# Patient Record
Sex: Male | Born: 1980 | Race: White | Hispanic: No | Marital: Married | Smoking: Former smoker
Health system: Southern US, Community
[De-identification: ages and names within clinical notes are randomized; demographics above are authoritative.]

## PROBLEM LIST (undated history)

## (undated) DIAGNOSIS — E119 Type 2 diabetes mellitus without complications: Secondary | ICD-10-CM

## (undated) DIAGNOSIS — F419 Anxiety disorder, unspecified: Secondary | ICD-10-CM

## (undated) HISTORY — PX: EYE SURGERY: SHX253

## (undated) HISTORY — PX: CATARACT EXTRACTION: SUR2

---

## 2015-11-05 ENCOUNTER — Emergency Department
Admission: EM | Admit: 2015-11-05 | Discharge: 2015-11-05 | Disposition: A | Discharge status: Home / Self Care | Payer: Self-pay | Attending: Emergency Medicine | Admitting: Emergency Medicine

## 2015-11-05 ENCOUNTER — Encounter: Payer: Self-pay | Admitting: Emergency Medicine

## 2015-11-05 ENCOUNTER — Emergency Department: Payer: Self-pay

## 2015-11-05 DIAGNOSIS — F17200 Nicotine dependence, unspecified, uncomplicated: Secondary | ICD-10-CM | POA: Insufficient documentation

## 2015-11-05 DIAGNOSIS — Z794 Long term (current) use of insulin: Secondary | ICD-10-CM | POA: Insufficient documentation

## 2015-11-05 DIAGNOSIS — E109 Type 1 diabetes mellitus without complications: Secondary | ICD-10-CM | POA: Insufficient documentation

## 2015-11-05 DIAGNOSIS — M791 Myalgia, unspecified site: Secondary | ICD-10-CM

## 2015-11-05 HISTORY — DX: Type 2 diabetes mellitus without complications: E11.9

## 2015-11-05 LAB — CBC WITH DIFFERENTIAL/PLATELET
Basophils Absolute: 0 10*3/uL (ref 0–0.1)
Basophils Relative: 0 %
Eosinophils Absolute: 0.2 10*3/uL (ref 0–0.7)
Eosinophils Relative: 2 %
HCT: 37.1 % — ABNORMAL LOW (ref 40.0–52.0)
Hemoglobin: 12.4 g/dL — ABNORMAL LOW (ref 13.0–18.0)
LYMPHS ABS: 0.8 10*3/uL — AB (ref 1.0–3.6)
LYMPHS PCT: 5 %
MCH: 29.2 pg (ref 26.0–34.0)
MCHC: 33.4 g/dL (ref 32.0–36.0)
MCV: 87.4 fL (ref 80.0–100.0)
Monocytes Absolute: 0.6 10*3/uL (ref 0.2–1.0)
Monocytes Relative: 4 %
NEUTROS ABS: 13.8 10*3/uL — AB (ref 1.4–6.5)
NEUTROS PCT: 89 %
PLATELETS: 287 10*3/uL (ref 150–440)
RBC: 4.24 MIL/uL — AB (ref 4.40–5.90)
RDW: 14.5 % (ref 11.5–14.5)
WBC: 15.5 10*3/uL — AB (ref 3.8–10.6)

## 2015-11-05 LAB — COMPREHENSIVE METABOLIC PANEL
ALT: 15 U/L — ABNORMAL LOW (ref 17–63)
ANION GAP: 8 (ref 5–15)
AST: 21 U/L (ref 15–41)
Albumin: 3.5 g/dL (ref 3.5–5.0)
Alkaline Phosphatase: 89 U/L (ref 38–126)
BILIRUBIN TOTAL: 0.6 mg/dL (ref 0.3–1.2)
BUN: 31 mg/dL — AB (ref 6–20)
CHLORIDE: 104 mmol/L (ref 101–111)
CO2: 26 mmol/L (ref 22–32)
Calcium: 8.7 mg/dL — ABNORMAL LOW (ref 8.9–10.3)
Creatinine, Ser: 1.02 mg/dL (ref 0.61–1.24)
Glucose, Bld: 83 mg/dL (ref 65–99)
POTASSIUM: 4.1 mmol/L (ref 3.5–5.1)
Sodium: 138 mmol/L (ref 135–145)
TOTAL PROTEIN: 7.4 g/dL (ref 6.5–8.1)

## 2015-11-05 LAB — URINALYSIS COMPLETE WITH MICROSCOPIC (ARMC ONLY)
Bacteria, UA: NONE SEEN
Bilirubin Urine: NEGATIVE
Glucose, UA: NEGATIVE mg/dL
LEUKOCYTES UA: NEGATIVE
Nitrite: NEGATIVE
PH: 5 (ref 5.0–8.0)
PROTEIN: 100 mg/dL — AB
Specific Gravity, Urine: 1.03 (ref 1.005–1.030)
Squamous Epithelial / LPF: NONE SEEN

## 2015-11-05 LAB — GLUCOSE, CAPILLARY
GLUCOSE-CAPILLARY: 23 mg/dL — AB (ref 65–99)
GLUCOSE-CAPILLARY: 70 mg/dL (ref 65–99)
GLUCOSE-CAPILLARY: 115 mg/dL — AB (ref 65–99)

## 2015-11-05 LAB — SEDIMENTATION RATE: Sed Rate: 55 mm/hr — ABNORMAL HIGH (ref 0–15)

## 2015-11-05 LAB — CK: Total CK: 67 U/L (ref 49–397)

## 2015-11-05 LAB — LACTIC ACID, PLASMA: Lactic Acid, Venous: 1 mmol/L (ref 0.5–2.0)

## 2015-11-05 MED ORDER — KETOROLAC TROMETHAMINE 30 MG/ML IJ SOLN
30.0000 mg | Freq: Once | INTRAMUSCULAR | Status: AC
  Administered 2015-11-05: 30 mg via INTRAVENOUS
  Filled 2015-11-05: qty 1

## 2015-11-05 MED ORDER — DEXTROSE 50 % IV SOLN
25.0000 g | Freq: Once | INTRAVENOUS | Status: AC
  Administered 2015-11-05: 25 g via INTRAVENOUS

## 2015-11-05 MED ORDER — METHOCARBAMOL 750 MG PO TABS: 750.0000 mg | ORAL_TABLET | Freq: Four times a day (QID) | ORAL | Status: DC

## 2015-11-05 MED ORDER — DEXTROSE 50 % IV SOLN
INTRAVENOUS | Status: AC
  Administered 2015-11-05: 25 g via INTRAVENOUS
  Filled 2015-11-05: qty 100

## 2015-11-05 MED ORDER — KETOROLAC TROMETHAMINE 10 MG PO TABS: 10.0000 mg | ORAL_TABLET | Freq: Four times a day (QID) | ORAL | Status: DC | PRN

## 2015-11-05 MED ORDER — TRAMADOL HCL 50 MG PO TABS
50.0000 mg | ORAL_TABLET | Freq: Once | ORAL | Status: AC
  Administered 2015-11-05: 50 mg via ORAL
  Filled 2015-11-05: qty 1

## 2015-11-05 MED ORDER — DOXYCYCLINE MONOHYDRATE 100 MG PO CAPS: 100.0000 mg | ORAL_CAPSULE | Freq: Two times a day (BID) | ORAL | Status: DC

## 2015-11-05 MED ORDER — METHOCARBAMOL 500 MG PO TABS
1000.0000 mg | ORAL_TABLET | Freq: Once | ORAL | Status: AC
Start: 2015-11-05 — End: 2015-11-05
  Administered 2015-11-05: 1000 mg via ORAL
  Filled 2015-11-05: qty 2

## 2015-11-05 MED ORDER — SODIUM CHLORIDE 0.9 % IV BOLUS (SEPSIS)
1000.0000 mL | Freq: Once | INTRAVENOUS | Status: AC
  Administered 2015-11-05: 1000 mL via INTRAVENOUS

## 2015-11-05 MED ORDER — TRAMADOL HCL 50 MG PO TABS: 50.0000 mg | ORAL_TABLET | Freq: Four times a day (QID) | ORAL | Status: AC | PRN

## 2015-11-05 NOTE — ED Notes (Signed)
Feels likes his glucose is dropping   FSBS  23  MD aware

## 2015-11-05 NOTE — ED Notes (Signed)
Brought in via ems from home   States he developed generalized body pain/aches for the past 3 days   Denies any n/v/d and unsure of fever. States he became worse yesterday   Having trouble opening doors d/t pain

## 2015-11-05 NOTE — ED Notes (Signed)
Min relief with IV pain meds  Family at bedside

## 2015-11-05 NOTE — ED Provider Notes (Signed)
[facility] Emergency Department Provider Note  ____________________________________________  Time seen: Approximately 8:25 AM  I have reviewed the triage vital signs and the nursing notes.   HISTORY  Chief Complaint Pain    HPI Jackson Miller is a 35 y.o. male patient complaining of increasing generalized body ache pain for 3 days. Patient state onset after hiking incident. Patient state that noticed any insect bite or rash with onset of his complaint. Patient denies any URI signs symptoms. Patient also denies any nausea vomiting diarrhea. Patient has taken a flu shot this season. Patient state this complaint worsens just today. He states having night sweats and increasing joint pain. 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 Patient stated no relief with over-the-counter anti-inflammatory medications. Patient is a diabetic type I. Patient's glucose elevated this morning or return back to normal values status post home insulin injection. Patient is rating his pain as a 10 over 10.Marland Kitchen   Past Medical History  Diagnosis Date  . Diabetes mellitus without complication (HCC)     There are no active problems to display for this patient.   History reviewed. No pertinent past surgical history.  Current Outpatient Rx  Name  Route  Sig  Dispense  Refill  . insulin glargine (LANTUS) 100 UNIT/ML injection   Subcutaneous   Inject into the skin at bedtime.         Marland Kitchen ketorolac (TORADOL) 10 MG tablet   Oral   Take 1 tablet (10 mg total) by mouth every 6 (six) hours as needed.   20 tablet   0   . methocarbamol (ROBAXIN-750) 750 MG tablet   Oral   Take 1 tablet (750 mg total) by mouth 4 (four) times daily.   20 tablet   0   . traMADol (ULTRAM) 50 MG tablet   Oral   Take 1 tablet (50 mg total) by mouth every 6 (six) hours as needed.   20 tablet   0     Allergies Reglan  History  reviewed. No pertinent family history.  Social History Social History  Substance Use Topics  . Smoking status: Current Every Day Smoker  . Smokeless tobacco: None  . Alcohol Use: No    Review of Systems Constitutional: Fever chills and body aches.  Eyes: No visual changes. ENT: No sore throat. Cardiovascular: Denies chest pain. Respiratory: Denies shortness of breath. Gastrointestinal: No abdominal pain.  No nausea, no vomiting.  No diarrhea.  No constipation. Genitourinary: Negative for dysuria. Musculoskeletal: Negative for back pain. Skin: Negative for rash. Neurological: Negative for headaches, focal weakness or numbness. Endocrine:Diabetes insulin-dependent 10-point ROS otherwise negative.  ____________________________________________   PHYSICAL EXAM:  VITAL SIGNS: ED Triage Vitals  Enc Vitals Group     BP --      Pulse --      Resp --      Temp --      Temp src --      SpO2 --      Weight --      Height --      Head Cir --      Peak Flow --      Pain Score --      Pain Loc --      Pain Edu? --      Excl. in GC? --     Constitutional: Alert and oriented. Appears malaise  Eyes: Conjunctivae are normal. PERRL. EOMI.  Head: Atraumatic. Nose: No congestion/rhinnorhea. Mouth/Throat: Mucous membranes are moist.  Oropharynx non-erythematous. Neck: No stridor. No cervical spine tenderness to palpation. Hematological/Lymphatic/Immunilogical: No cervical lymphadenopathy. Cardiovascular: Normal rate, regular rhythm. Grossly normal heart sounds.  Good peripheral circulation. Respiratory: Normal respiratory effort.  No retractions. Lungs CTAB. Gastrointestinal: Soft and nontender. No distention. No abdominal bruits. No CVA tenderness. Musculoskeletal: No lower extremity tenderness nor edema.  No joint effusions. Neurologic:  Normal speech and language. No gross focal neurologic deficits are appreciated. No gait instability. Skin:  Skin is warm, dry and intact. No  rash noted. Psychiatric: Mood and affect are normal. Speech and behavior are normal.  ____________________________________________   LABS (all labs ordered are listed, but only abnormal results are displayed)  Labs Reviewed  URINALYSIS COMPLETEWITH MICROSCOPIC ([facility] ONLY) - Abnormal; Notable for the following:    Color, Urine YELLOW (*)    APPearance CLEAR (*)    Ketones, ur 1+ (*)    Hgb urine dipstick 1+ (*)    Protein, ur 100 (*)    All other components within normal limits  COMPREHENSIVE METABOLIC PANEL - Abnormal; Notable for the following:    BUN 31 (*)    Calcium 8.7 (*)    ALT 15 (*)    All other components within normal limits  CBC WITH DIFFERENTIAL/PLATELET - Abnormal; Notable for the following:    WBC 15.5 (*)    RBC 4.24 (*)    Hemoglobin 12.4 (*)    HCT 37.1 (*)    Neutro Abs 13.8 (*)    Lymphs Abs 0.8 (*)    All other components within normal limits  SEDIMENTATION RATE - Abnormal; Notable for the following:    Sed Rate 55 (*)    All other components within normal limits  GLUCOSE, CAPILLARY - Abnormal; Notable for the following:    Glucose-Capillary 23 (*)    All other components within normal limits  CK  LACTIC ACID, PLASMA  GLUCOSE, CAPILLARY  LACTIC ACID, PLASMA   ____________________________________________  EKG   ____________________________________________  RADIOLOGY   ____________________________________________   PROCEDURES  Procedure(s) performed: None  Critical Care performed: No  ____________________________________________   INITIAL IMPRESSION / ASSESSMENT AND PLAN / ED COURSE  Pertinent labs & imaging results that were available during my care of the patient were reviewed by me and considered in my medical decision making (see chart for details).  Myofascial pain. Discussed lab results with patient. Advised patient to return to ER if his condition worsen 2-3 days. Patient advised to follow-up [place].  Patient given a prescription for Robaxin, tramadol, ibuprofen, and doxycycline. ____________________________________________   FINAL CLINICAL IMPRESSION(S) / ED DIAGNOSES  Final diagnoses:  Myalgia       Joni ReiningRonald K Onyx Edgley, PA-C 11/05/15 1137  Emily FilbertJonathan E Williams, MD 11/07/15 [phone]

## 2015-11-05 NOTE — ED Notes (Signed)
See triage.  [place]  Then sx's started. States he did not have any fever but did have night sweats last pm

## 2016-07-26 ENCOUNTER — Encounter: Payer: Self-pay | Admitting: Emergency Medicine

## 2016-07-26 ENCOUNTER — Emergency Department
Admission: EM | Admit: 2016-07-26 | Discharge: 2016-07-27 | Disposition: A | Discharge status: Home / Self Care | Payer: BLUE CROSS/BLUE SHIELD | Attending: Emergency Medicine | Admitting: Emergency Medicine

## 2016-07-26 DIAGNOSIS — Z79899 Other long term (current) drug therapy: Secondary | ICD-10-CM | POA: Insufficient documentation

## 2016-07-26 DIAGNOSIS — E119 Type 2 diabetes mellitus without complications: Secondary | ICD-10-CM | POA: Diagnosis not present

## 2016-07-26 DIAGNOSIS — R197 Diarrhea, unspecified: Secondary | ICD-10-CM

## 2016-07-26 DIAGNOSIS — E860 Dehydration: Secondary | ICD-10-CM | POA: Insufficient documentation

## 2016-07-26 DIAGNOSIS — R1013 Epigastric pain: Secondary | ICD-10-CM | POA: Diagnosis not present

## 2016-07-26 DIAGNOSIS — R112 Nausea with vomiting, unspecified: Secondary | ICD-10-CM | POA: Diagnosis present

## 2016-07-26 DIAGNOSIS — Z794 Long term (current) use of insulin: Secondary | ICD-10-CM | POA: Insufficient documentation

## 2016-07-26 DIAGNOSIS — Z87891 Personal history of nicotine dependence: Secondary | ICD-10-CM | POA: Insufficient documentation

## 2016-07-26 LAB — COMPREHENSIVE METABOLIC PANEL
ALK PHOS: 75 U/L (ref 38–126)
ALT: 12 U/L — AB (ref 17–63)
AST: 17 U/L (ref 15–41)
Albumin: 3.2 g/dL — ABNORMAL LOW (ref 3.5–5.0)
Anion gap: 7 (ref 5–15)
BILIRUBIN TOTAL: 0.4 mg/dL (ref 0.3–1.2)
BUN: 23 mg/dL — ABNORMAL HIGH (ref 6–20)
CALCIUM: 8.6 mg/dL — AB (ref 8.9–10.3)
CO2: 23 mmol/L (ref 22–32)
CREATININE: 1.21 mg/dL (ref 0.61–1.24)
Chloride: 103 mmol/L (ref 101–111)
GFR calc non Af Amer: >60 mL/min (ref >60)
Glucose, Bld: 160 mg/dL — ABNORMAL HIGH (ref 65–99)
Potassium: 4.3 mmol/L (ref 3.5–5.1)
SODIUM: 133 mmol/L — AB (ref 135–145)
TOTAL PROTEIN: 6.8 g/dL (ref 6.5–8.1)

## 2016-07-26 LAB — CBC WITH DIFFERENTIAL/PLATELET
Basophils Absolute: 0 10*3/uL (ref 0–0.1)
Basophils Relative: 1 %
EOS ABS: 0.1 10*3/uL (ref 0–0.7)
Eosinophils Relative: 2 %
HEMATOCRIT: 37.9 % — AB (ref 40.0–52.0)
HEMOGLOBIN: 13 g/dL (ref 13.0–18.0)
LYMPHS ABS: 0.8 10*3/uL — AB (ref 1.0–3.6)
LYMPHS PCT: 18 %
MCH: 28.9 pg (ref 26.0–34.0)
MCHC: 34.4 g/dL (ref 32.0–36.0)
MCV: 84.2 fL (ref 80.0–100.0)
MONOS PCT: 14 %
Monocytes Absolute: 0.7 10*3/uL (ref 0.2–1.0)
NEUTROS ABS: 3.2 10*3/uL (ref 1.4–6.5)
NEUTROS PCT: 67 %
Platelets: 380 10*3/uL (ref 150–440)
RBC: 4.5 MIL/uL (ref 4.40–5.90)
RDW: 13.4 % (ref 11.5–14.5)
WBC: 4.8 10*3/uL (ref 3.8–10.6)

## 2016-07-26 LAB — LIPASE, BLOOD: Lipase: 15 U/L (ref 11–51)

## 2016-07-26 MED ORDER — SODIUM CHLORIDE 0.9 % IV BOLUS (SEPSIS)
1000.0000 mL | Freq: Once | INTRAVENOUS | Status: AC
  Administered 2016-07-26: 1000 mL via INTRAVENOUS

## 2016-07-26 NOTE — ED Triage Notes (Signed)
Per EMS pt has hx of diabetes and states he does not have financial resources for glucose strips and has been self dosing according to his symptoms.  Today he took 14U of Novolog at 830pm and when EMS picked him up for his n/v/d symptoms, they read 123 glu.  [facility], patient is reading 156 glu.  Pt reports waking up in diarrhea in bed last night.  Pt is AOx4 and co upper epigastric and HA pain with no radiation.

## 2016-07-27 LAB — TROPONIN I: Troponin I: <0.03 ng/mL (ref <0.03)

## 2016-07-27 LAB — GLUCOSE, CAPILLARY: GLUCOSE-CAPILLARY: 197 mg/dL — AB (ref 65–99)

## 2016-07-27 MED ORDER — ONDANSETRON HCL 4 MG/2ML IJ SOLN
INTRAMUSCULAR | Status: AC
Start: 2016-07-27 — End: 2016-07-27
  Administered 2016-07-27: 4 mg via INTRAVENOUS
  Filled 2016-07-27: qty 2

## 2016-07-27 MED ORDER — GI COCKTAIL ~~LOC~~
30.0000 mL | Freq: Once | ORAL | Status: AC
  Administered 2016-07-27: 30 mL via ORAL
  Filled 2016-07-27: qty 30

## 2016-07-27 MED ORDER — DIPHENHYDRAMINE HCL 50 MG/ML IJ SOLN
25.0000 mg | Freq: Once | INTRAMUSCULAR | Status: AC
  Administered 2016-07-27: 25 mg via INTRAVENOUS
  Filled 2016-07-27: qty 1

## 2016-07-27 MED ORDER — SUCRALFATE 1 G PO TABS
1.0000 g | ORAL_TABLET | Freq: Once | ORAL | Status: AC
  Administered 2016-07-27: 1 g via ORAL
  Filled 2016-07-27: qty 1

## 2016-07-27 MED ORDER — PROCHLORPERAZINE EDISYLATE 5 MG/ML IJ SOLN
10.0000 mg | Freq: Once | INTRAMUSCULAR | Status: AC
  Administered 2016-07-27: 10 mg via INTRAVENOUS
  Filled 2016-07-27: qty 2

## 2016-07-27 MED ORDER — SODIUM CHLORIDE 0.9 % IV BOLUS (SEPSIS)
1000.0000 mL | Freq: Once | INTRAVENOUS | Status: AC
  Administered 2016-07-27: 1000 mL via INTRAVENOUS

## 2016-07-27 MED ORDER — PROCHLORPERAZINE MALEATE 10 MG PO TABS
10.0000 mg | ORAL_TABLET | Freq: Three times a day (TID) | ORAL | 0 refills | Status: DC | PRN
Start: 2016-07-27 — End: 2018-02-01

## 2016-07-27 MED ORDER — ONDANSETRON HCL 4 MG/2ML IJ SOLN
4.0000 mg | Freq: Once | INTRAMUSCULAR | Status: AC
  Administered 2016-07-27: 4 mg via INTRAVENOUS

## 2016-07-27 MED ORDER — SUCRALFATE 1 G PO TABS: 1.0000 g | ORAL_TABLET | Freq: Two times a day (BID) | ORAL | 0 refills | Status: DC

## 2016-07-27 NOTE — ED Provider Notes (Signed)
[facility] Emergency Department Provider Note   ____________________________________________   First MD Initiated Contact with Patient 07/26/16 2358     (approximate)  I have reviewed the triage vital signs and the nursing notes.   HISTORY  Chief Complaint Nausea; Emesis; and Diarrhea    HPI Jackson Miller is a 35 y.o. male who comes into the hospital today feeling ill. He reports that he became very sick on Saturday. He reports he had a fever 200.7, vomiting, chills and diarrhea. The patient reports that he has continued to have diarrhea often in the last 24 hours. The patient reports that he is dehydrated. He was only able to have some cereal and a bowl of soup today. He has some abdominal pain he reports mainly in the middle of his abdomen. He has a headache as well. The patient denies any sick contacts and he states he has been able to keep his blood sugars under control. The patient reports that this pain is similar to when he's had gastroparesis in the past. He rates his pain a 9 out of 10 in intensity. He reports that he said 12 episodes of diarrhea and approximately 8 hours. The patient reports he could no longer manage the diarrhea, pain and vomiting so he decided to come into the hospital for evaluation.   Past Medical History:  Diagnosis Date  . Diabetes mellitus without complication (HCC)     There are no active problems to display for this patient.   Past Surgical History:  Procedure Laterality Date  . CATARACT EXTRACTION Left     Prior to Admission medications   Medication Sig Start Date End Date Taking? Authorizing Provider  doxycycline (MONODOX) 100 MG capsule Take 1 capsule (100 mg total) by mouth 2 (two) times daily. 11/05/15  Yes Joni Reiningonald K Smith, PA-C  insulin glargine (LANTUS) 100 UNIT/ML injection Inject into the skin at bedtime.   Yes Historical Provider, MD  ketorolac (TORADOL) 10 MG tablet Take 1 tablet (10 mg total) by mouth  every 6 (six) hours as needed. 11/05/15  Yes Joni Reiningonald K Smith, PA-C  methocarbamol (ROBAXIN-750) 750 MG tablet Take 1 tablet (750 mg total) by mouth 4 (four) times daily. 11/05/15  Yes Joni Reiningonald K Smith, PA-C  traMADol (ULTRAM) 50 MG tablet Take 1 tablet (50 mg total) by mouth every 6 (six) hours as needed. 11/05/15 11/04/16 Yes Joni Reiningonald K Smith, PA-C  prochlorperazine (COMPAZINE) 10 MG tablet Take 1 tablet (10 mg total) by mouth every 8 (eight) hours as needed for nausea or vomiting. 07/27/16 07/27/17  Rebecka ApleyAllison P Webster, MD  sucralfate (CARAFATE) 1 g tablet Take 1 tablet (1 g total) by mouth 2 (two) times daily. 07/27/16   Rebecka ApleyAllison P Webster, MD    Allergies Reglan [metoclopramide]  No family history on file.  Social History Social History  Substance Use Topics  . Smoking status: Former Games developermoker  . Smokeless tobacco: Never Used  . Alcohol use No    Review of Systems Constitutional: Fever Eyes: No visual changes. ENT: No sore throat. Cardiovascular: Denies chest pain. Respiratory: Denies shortness of breath. Gastrointestinal:  abdominal pain, nausea, vomiting, diarrhea.   Genitourinary: Negative for dysuria. Musculoskeletal: Negative for back pain. Skin: Negative for rash. Neurological: Negative for headaches, focal weakness or numbness.  10-point ROS otherwise negative.  ____________________________________________   PHYSICAL EXAM:  VITAL SIGNS: ED Triage Vitals  Enc Vitals Group     BP 07/26/16 2245 125/79     Pulse Rate 07/26/16 2245  96     Resp 07/26/16 2245 13     Temp 07/26/16 2245 99.2 F (37.3 C)     Temp Source 07/26/16 2245 Oral     SpO2 07/26/16 2245 97 %     Weight 07/26/16 2246 167 lb 1.6 oz (75.8 kg)     Height 07/26/16 2246 5\' 8"  (1.727 m)     Head Circumference --      Peak Flow --      Pain Score 07/26/16 2247 7     Pain Loc --      Pain Edu? --      Excl. in GC? --     Constitutional: Alert and oriented. Well appearing and in Moderate distress. Eyes:  Conjunctivae are normal. PERRL. EOMI. Head: Atraumatic. Nose: No congestion/rhinnorhea. Mouth/Throat: Mucous membranes are moist.  Oropharynx non-erythematous. Cardiovascular: Normal rate, regular rhythm. Grossly normal heart sounds.  Good peripheral circulation. Respiratory: Normal respiratory effort.  No retractions. Lungs CTAB. Gastrointestinal: Soft with epigastric tenderness to palpation. No distention. Positive bowel sounds Musculoskeletal: No lower extremity tenderness nor edema.   Neurologic:  Normal speech and language.  Skin:  Skin is warm, dry and intact.  Psychiatric: Mood and affect are normal.   ____________________________________________   LABS (all labs ordered are listed, but only abnormal results are displayed)  Labs Reviewed  CBC WITH DIFFERENTIAL/PLATELET - Abnormal; Notable for the following:       Result Value   HCT 37.9 (*)    Lymphs Abs 0.8 (*)    All other components within normal limits  COMPREHENSIVE METABOLIC PANEL - Abnormal; Notable for the following:    Sodium 133 (*)    Glucose, Bld 160 (*)    BUN 23 (*)    Calcium 8.6 (*)    Albumin 3.2 (*)    ALT 12 (*)    All other components within normal limits  LIPASE, BLOOD  TROPONIN I  GLUCOSE, CAPILLARY  CBG MONITORING, ED   ____________________________________________  EKG  ED ECG REPORT I, Rebecka ApleyWebster,  Allison P, the attending physician, personally viewed and interpreted this ECG.   Date: 07/26/2016  EKG Time: 2245  Rate: 98  Rhythm: normal sinus rhythm  Axis: normal  Intervals:none  ST&T Change: none  ____________________________________________  RADIOLOGY  none ____________________________________________   PROCEDURES  Procedure(s) performed: None  Procedures  Critical Care performed: No  ____________________________________________   INITIAL IMPRESSION / ASSESSMENT AND PLAN / ED COURSE  Pertinent labs & imaging results that were available during my care of the patient  were reviewed by me and considered in my medical decision making (see chart for details).  This is a 35 year old male who comes into the hospital today with some epigastric abdominal pain, fever, nausea vomiting and diarrhea. The patient has a history of diabetes. He does not appear to be in DKA as his bicarbonate was normal and his anion gap is 7. I will give the patient liter of normal saline and initially I gave him Zofran. He does have a headaches I will also give him some Compazine and Benadryl. Given his abdominal pain and his concern for ulcer versus gastroparesis I will give him some GI cocktail and Carafate. I will then reassess the patient. If he continues to have pain I will consider doing a CT scan to evaluate his abdomen. The patient's lipase is negative but he may also have some pancreatitis causing his symptoms.  Clinical Course    After the GI cocktail, Compazine and Carafate the  patient reports this pain is much improved. He was also able to drink without having any episodes of vomiting in the emergency department. As his pain is improved and the blood work is unremarkable I will discharge the patient home. The patient will follow up with his primary care physician and he has no further questions or concerns at this time.  ____________________________________________   FINAL CLINICAL IMPRESSION(S) / ED DIAGNOSES  Final diagnoses:  Nausea vomiting and diarrhea  Dehydration  Epigastric pain      NEW MEDICATIONS STARTED DURING THIS VISIT:  New Prescriptions   PROCHLORPERAZINE (COMPAZINE) 10 MG TABLET    Take 1 tablet (10 mg total) by mouth every 8 (eight) hours as needed for nausea or vomiting.   SUCRALFATE (CARAFATE) 1 G TABLET    Take 1 tablet (1 g total) by mouth 2 (two) times daily.     Note:  This document was prepared using Dragon voice recognition software and may include unintentional dictation errors.    Rebecka Apley, MD 07/27/16 [phone]

## 2016-07-27 NOTE — ED Notes (Signed)
Applied warm compress to arm after removed EMS IV.

## 2016-10-29 ENCOUNTER — Telehealth: Payer: Self-pay | Admitting: Internal Medicine

## 2016-12-16 ENCOUNTER — Ambulatory Visit: Payer: BLUE CROSS/BLUE SHIELD | Admitting: Internal Medicine

## 2016-12-16 ENCOUNTER — Telehealth: Payer: Self-pay | Admitting: Internal Medicine

## 2016-12-16 NOTE — Telephone Encounter (Signed)
This is a new patient. No follow-up necessary.

## 2016-12-16 NOTE — Telephone Encounter (Signed)
Patient no showed today's appt. Please advise on how to follow up. °A. No follow up necessary. °B. Follow up urgent. Contact patient immediately. °C. Follow up necessary. Contact patient and schedule visit in ___ days. °D. Follow up advised. Contact patient and schedule visit in ____weeks. ° °

## 2017-01-06 NOTE — Telephone Encounter (Signed)
Called twice called dropped.pb

## 2017-10-28 ENCOUNTER — Inpatient Hospital Stay
Admission: EM | Admit: 2017-10-28 | Discharge: 2017-10-30 | DRG: 637 | Disposition: A | Discharge status: Home / Self Care | Payer: Medicaid Other | Attending: Internal Medicine | Admitting: Internal Medicine

## 2017-10-28 ENCOUNTER — Other Ambulatory Visit: Payer: Self-pay

## 2017-10-28 DIAGNOSIS — R21 Rash and other nonspecific skin eruption: Secondary | ICD-10-CM | POA: Diagnosis not present

## 2017-10-28 DIAGNOSIS — M25519 Pain in unspecified shoulder: Secondary | ICD-10-CM | POA: Diagnosis present

## 2017-10-28 DIAGNOSIS — F411 Generalized anxiety disorder: Secondary | ICD-10-CM | POA: Diagnosis present

## 2017-10-28 DIAGNOSIS — Z9841 Cataract extraction status, right eye: Secondary | ICD-10-CM | POA: Diagnosis not present

## 2017-10-28 DIAGNOSIS — E1010 Type 1 diabetes mellitus with ketoacidosis without coma: Secondary | ICD-10-CM | POA: Diagnosis not present

## 2017-10-28 DIAGNOSIS — E875 Hyperkalemia: Secondary | ICD-10-CM | POA: Diagnosis present

## 2017-10-28 DIAGNOSIS — M79672 Pain in left foot: Secondary | ICD-10-CM | POA: Diagnosis present

## 2017-10-28 DIAGNOSIS — Z9842 Cataract extraction status, left eye: Secondary | ICD-10-CM | POA: Diagnosis not present

## 2017-10-28 DIAGNOSIS — M1120 Other chondrocalcinosis, unspecified site: Secondary | ICD-10-CM | POA: Diagnosis present

## 2017-10-28 DIAGNOSIS — Z8249 Family history of ischemic heart disease and other diseases of the circulatory system: Secondary | ICD-10-CM

## 2017-10-28 DIAGNOSIS — E1110 Type 2 diabetes mellitus with ketoacidosis without coma: Secondary | ICD-10-CM | POA: Diagnosis present

## 2017-10-28 DIAGNOSIS — M25571 Pain in right ankle and joints of right foot: Secondary | ICD-10-CM | POA: Diagnosis present

## 2017-10-28 DIAGNOSIS — Z87891 Personal history of nicotine dependence: Secondary | ICD-10-CM | POA: Diagnosis not present

## 2017-10-28 DIAGNOSIS — E871 Hypo-osmolality and hyponatremia: Secondary | ICD-10-CM | POA: Diagnosis present

## 2017-10-28 DIAGNOSIS — E10618 Type 1 diabetes mellitus with other diabetic arthropathy: Secondary | ICD-10-CM | POA: Diagnosis present

## 2017-10-28 DIAGNOSIS — M79641 Pain in right hand: Secondary | ICD-10-CM | POA: Diagnosis present

## 2017-10-28 DIAGNOSIS — F909 Attention-deficit hyperactivity disorder, unspecified type: Secondary | ICD-10-CM | POA: Diagnosis present

## 2017-10-28 DIAGNOSIS — M7732 Calcaneal spur, left foot: Secondary | ICD-10-CM | POA: Diagnosis present

## 2017-10-28 DIAGNOSIS — G2581 Restless legs syndrome: Secondary | ICD-10-CM | POA: Diagnosis present

## 2017-10-28 DIAGNOSIS — E878 Other disorders of electrolyte and fluid balance, not elsewhere classified: Secondary | ICD-10-CM | POA: Diagnosis present

## 2017-10-28 DIAGNOSIS — E10649 Type 1 diabetes mellitus with hypoglycemia without coma: Secondary | ICD-10-CM | POA: Diagnosis not present

## 2017-10-28 DIAGNOSIS — M25572 Pain in left ankle and joints of left foot: Secondary | ICD-10-CM

## 2017-10-28 DIAGNOSIS — Z79899 Other long term (current) drug therapy: Secondary | ICD-10-CM | POA: Diagnosis not present

## 2017-10-28 DIAGNOSIS — R609 Edema, unspecified: Secondary | ICD-10-CM

## 2017-10-28 DIAGNOSIS — F1290 Cannabis use, unspecified, uncomplicated: Secondary | ICD-10-CM | POA: Diagnosis present

## 2017-10-28 DIAGNOSIS — Z888 Allergy status to other drugs, medicaments and biological substances status: Secondary | ICD-10-CM | POA: Diagnosis not present

## 2017-10-28 DIAGNOSIS — G0491 Myelitis, unspecified: Secondary | ICD-10-CM | POA: Diagnosis present

## 2017-10-28 DIAGNOSIS — M79642 Pain in left hand: Secondary | ICD-10-CM | POA: Diagnosis present

## 2017-10-28 DIAGNOSIS — N179 Acute kidney failure, unspecified: Secondary | ICD-10-CM | POA: Diagnosis present

## 2017-10-28 DIAGNOSIS — R739 Hyperglycemia, unspecified: Secondary | ICD-10-CM

## 2017-10-28 DIAGNOSIS — Z794 Long term (current) use of insulin: Secondary | ICD-10-CM

## 2017-10-28 DIAGNOSIS — Z9119 Patient's noncompliance with other medical treatment and regimen: Secondary | ICD-10-CM

## 2017-10-28 LAB — BASIC METABOLIC PANEL
ANION GAP: 9 (ref 5–15)
BUN: 27 mg/dL — ABNORMAL HIGH (ref 6–20)
CHLORIDE: 102 mmol/L (ref 101–111)
CO2: 25 mmol/L (ref 22–32)
Calcium: 8.7 mg/dL — ABNORMAL LOW (ref 8.9–10.3)
Creatinine, Ser: 1.15 mg/dL (ref 0.61–1.24)
GFR calc non Af Amer: >60 mL/min (ref >60)
GLUCOSE: 133 mg/dL — AB (ref 65–99)
Potassium: 4.4 mmol/L (ref 3.5–5.1)
Sodium: 136 mmol/L (ref 135–145)

## 2017-10-28 LAB — CBC WITH DIFFERENTIAL/PLATELET
BASOS PCT: 0 %
Basophils Absolute: 0 10*3/uL (ref 0–0.1)
Eosinophils Absolute: 0.1 10*3/uL (ref 0–0.7)
Eosinophils Relative: 2 %
HEMATOCRIT: 42 % (ref 40.0–52.0)
HEMOGLOBIN: 13.2 g/dL (ref 13.0–18.0)
Lymphocytes Relative: 11 %
Lymphs Abs: 0.9 10*3/uL — ABNORMAL LOW (ref 1.0–3.6)
MCH: 28.8 pg (ref 26.0–34.0)
MCHC: 31.5 g/dL — ABNORMAL LOW (ref 32.0–36.0)
MCV: 91.4 fL (ref 80.0–100.0)
MONOS PCT: 4 %
Monocytes Absolute: 0.3 10*3/uL (ref 0.2–1.0)
NEUTROS ABS: 6.9 10*3/uL — AB (ref 1.4–6.5)
NEUTROS PCT: 83 %
Platelets: 322 10*3/uL (ref 150–440)
RBC: 4.59 MIL/uL (ref 4.40–5.90)
RDW: 16.7 % — ABNORMAL HIGH (ref 11.5–14.5)
WBC: 8.3 10*3/uL (ref 3.8–10.6)

## 2017-10-28 LAB — COMPREHENSIVE METABOLIC PANEL
ALBUMIN: 3.5 g/dL (ref 3.5–5.0)
ALK PHOS: 176 U/L — AB (ref 38–126)
ALT: 23 U/L (ref 17–63)
AST: 29 U/L (ref 15–41)
Anion gap: 17 — ABNORMAL HIGH (ref 5–15)
BILIRUBIN TOTAL: 2 mg/dL — AB (ref 0.3–1.2)
BUN: 31 mg/dL — AB (ref 6–20)
CALCIUM: 9.4 mg/dL (ref 8.9–10.3)
CO2: 20 mmol/L — ABNORMAL LOW (ref 22–32)
CREATININE: 1.59 mg/dL — AB (ref 0.61–1.24)
Chloride: 90 mmol/L — ABNORMAL LOW (ref 101–111)
GFR calc Af Amer: >60 mL/min (ref >60)
GFR calc non Af Amer: 55 mL/min — ABNORMAL LOW (ref >60)
GLUCOSE: 927 mg/dL — AB (ref 65–99)
Potassium: 5.4 mmol/L — ABNORMAL HIGH (ref 3.5–5.1)
Sodium: 127 mmol/L — ABNORMAL LOW (ref 135–145)
TOTAL PROTEIN: 7.9 g/dL (ref 6.5–8.1)

## 2017-10-28 LAB — CBC
HCT: 36.7 % — ABNORMAL LOW (ref 40.0–52.0)
Hemoglobin: 12 g/dL — ABNORMAL LOW (ref 13.0–18.0)
MCH: 28.7 pg (ref 26.0–34.0)
MCHC: 32.7 g/dL (ref 32.0–36.0)
MCV: 87.7 fL (ref 80.0–100.0)
Platelets: 317 K/uL (ref 150–440)
RBC: 4.18 MIL/uL — ABNORMAL LOW (ref 4.40–5.90)
RDW: 16.5 % — ABNORMAL HIGH (ref 11.5–14.5)
WBC: 8.7 K/uL (ref 3.8–10.6)

## 2017-10-28 LAB — BLOOD GAS, VENOUS
Acid-base deficit: 4.2 mmol/L — ABNORMAL HIGH (ref 0.0–2.0)
BICARBONATE: 22.6 mmol/L (ref 20.0–28.0)
O2 SAT: 69.3 %
PATIENT TEMPERATURE: 37
PCO2 VEN: 47 mmHg (ref 44.0–60.0)
PO2 VEN: 41 mmHg (ref 32.0–45.0)
pH, Ven: 7.29 (ref 7.250–7.430)

## 2017-10-28 LAB — BASIC METABOLIC PANEL WITH GFR
Anion gap: 14 (ref 5–15)
BUN: 27 mg/dL — ABNORMAL HIGH (ref 6–20)
CO2: 20 mmol/L — ABNORMAL LOW (ref 22–32)
Calcium: 8.7 mg/dL — ABNORMAL LOW (ref 8.9–10.3)
Chloride: 99 mmol/L — ABNORMAL LOW (ref 101–111)
Creatinine, Ser: 1.37 mg/dL — ABNORMAL HIGH (ref 0.61–1.24)
GFR calc Af Amer: >60 mL/min
GFR calc non Af Amer: >60 mL/min
Glucose, Bld: 377 mg/dL — ABNORMAL HIGH (ref 65–99)
Potassium: 4 mmol/L (ref 3.5–5.1)
Sodium: 133 mmol/L — ABNORMAL LOW (ref 135–145)

## 2017-10-28 LAB — GLUCOSE, CAPILLARY
GLUCOSE-CAPILLARY: 181 mg/dL — AB (ref 65–99)
GLUCOSE-CAPILLARY: 124 mg/dL — AB (ref 65–99)
GLUCOSE-CAPILLARY: 171 mg/dL — AB (ref 65–99)
GLUCOSE-CAPILLARY: 173 mg/dL — AB (ref 65–99)
Glucose-Capillary: 431 mg/dL — ABNORMAL HIGH (ref 65–99)
Glucose-Capillary: 278 mg/dL — ABNORMAL HIGH (ref 65–99)
Glucose-Capillary: 144 mg/dL — ABNORMAL HIGH (ref 65–99)
Glucose-Capillary: 190 mg/dL — ABNORMAL HIGH (ref 65–99)
Glucose-Capillary: 234 mg/dL — ABNORMAL HIGH (ref 65–99)
Glucose-Capillary: 186 mg/dL — ABNORMAL HIGH (ref 65–99)
Glucose-Capillary: 175 mg/dL — ABNORMAL HIGH (ref 65–99)
Glucose-Capillary: 245 mg/dL — ABNORMAL HIGH (ref 65–99)

## 2017-10-28 LAB — HEMOGLOBIN A1C
Hgb A1c MFr Bld: 8.3 % — ABNORMAL HIGH (ref 4.8–5.6)
Mean Plasma Glucose: 191.51 mg/dL

## 2017-10-28 LAB — URINALYSIS, COMPLETE (UACMP) WITH MICROSCOPIC
Bacteria, UA: NONE SEEN
Bilirubin Urine: NEGATIVE
Hgb urine dipstick: NEGATIVE
Ketones, ur: 20 mg/dL — AB
Leukocytes, UA: NEGATIVE
Nitrite: NEGATIVE
PH: 6 (ref 5.0–8.0)
Protein, ur: NEGATIVE mg/dL
RBC / HPF: NONE SEEN RBC/hpf (ref 0–5)
SQUAMOUS EPITHELIAL / LPF: NONE SEEN
Specific Gravity, Urine: 1.021 (ref 1.005–1.030)
WBC, UA: NONE SEEN WBC/hpf (ref 0–5)

## 2017-10-28 LAB — BETA-HYDROXYBUTYRIC ACID: Beta-Hydroxybutyric Acid: 4.4 mmol/L — ABNORMAL HIGH (ref 0.05–0.27)

## 2017-10-28 LAB — MRSA PCR SCREENING: MRSA by PCR: NEGATIVE

## 2017-10-28 MED ORDER — AMPHETAMINE-DEXTROAMPHETAMINE 5 MG PO TABS
20.0000 mg | ORAL_TABLET | Freq: Every day | ORAL | Status: DC
  Administered 2017-10-29 – 2017-10-30 (×2): 20 mg via ORAL
  Filled 2017-10-28: qty 4
  Filled 2017-10-28: qty 1
  Filled 2017-10-28 (×2): qty 4

## 2017-10-28 MED ORDER — ONDANSETRON HCL 4 MG/2ML IJ SOLN
4.0000 mg | Freq: Once | INTRAMUSCULAR | Status: AC
  Administered 2017-10-28: 4 mg via INTRAVENOUS
  Filled 2017-10-28: qty 2

## 2017-10-28 MED ORDER — INSULIN GLARGINE 100 UNIT/ML ~~LOC~~ SOLN
24.0000 [IU] | Freq: Once | SUBCUTANEOUS | Status: AC
  Administered 2017-10-28: 24 [IU] via SUBCUTANEOUS
  Filled 2017-10-28 (×2): qty 0.24

## 2017-10-28 MED ORDER — INSULIN ASPART 100 UNIT/ML ~~LOC~~ SOLN
3.0000 [IU] | Freq: Three times a day (TID) | SUBCUTANEOUS | Status: DC
  Administered 2017-10-28 – 2017-10-29 (×2): 3 [IU] via SUBCUTANEOUS
  Filled 2017-10-28 (×2): qty 1

## 2017-10-28 MED ORDER — DEXTROSE-NACL 5-0.45 % IV SOLN
INTRAVENOUS | Status: DC
  Administered 2017-10-28: 10:00:00 via INTRAVENOUS

## 2017-10-28 MED ORDER — PROMETHAZINE HCL 25 MG/ML IJ SOLN
12.5000 mg | Freq: Once | INTRAMUSCULAR | Status: AC
  Administered 2017-10-28: 12.5 mg via INTRAVENOUS
  Filled 2017-10-28: qty 1

## 2017-10-28 MED ORDER — AMPHETAMINE-DEXTROAMPHETAMINE 5 MG PO TABS
20.0000 mg | ORAL_TABLET | Freq: Once | ORAL | Status: AC
  Administered 2017-10-28: 20 mg via ORAL
  Filled 2017-10-28: qty 4

## 2017-10-28 MED ORDER — ENOXAPARIN SODIUM 40 MG/0.4ML ~~LOC~~ SOLN
40.0000 mg | SUBCUTANEOUS | Status: DC
Start: 2017-10-28 — End: 2017-10-30
  Filled 2017-10-28: qty 0.4

## 2017-10-28 MED ORDER — ONDANSETRON HCL 4 MG/2ML IJ SOLN
4.0000 mg | Freq: Four times a day (QID) | INTRAMUSCULAR | Status: DC | PRN
  Administered 2017-10-28 – 2017-10-30 (×6): 4 mg via INTRAVENOUS
  Filled 2017-10-28 (×6): qty 2

## 2017-10-28 MED ORDER — SODIUM CHLORIDE 0.9 % IV SOLN
INTRAVENOUS | Status: DC
  Administered 2017-10-28: 07:00:00 via INTRAVENOUS

## 2017-10-28 MED ORDER — CITALOPRAM HYDROBROMIDE 20 MG PO TABS
20.0000 mg | ORAL_TABLET | Freq: Every day | ORAL | Status: DC
  Administered 2017-10-28 – 2017-10-30 (×3): 20 mg via ORAL
  Filled 2017-10-28 (×3): qty 1

## 2017-10-28 MED ORDER — SODIUM CHLORIDE 0.9 % IV SOLN
INTRAVENOUS | Status: DC
  Administered 2017-10-28: 5.4 [IU]/h via INTRAVENOUS
  Filled 2017-10-28: qty 1

## 2017-10-28 MED ORDER — SODIUM CHLORIDE 0.9 % IV SOLN
INTRAVENOUS | Status: DC
  Administered 2017-10-28: 1.3 [IU]/h via INTRAVENOUS
  Administered 2017-10-28: 10.8 [IU]/h via INTRAVENOUS

## 2017-10-28 MED ORDER — LISINOPRIL 5 MG PO TABS: 5.0000 mg | ORAL_TABLET | Freq: Every day | ORAL | Status: DC

## 2017-10-28 MED ORDER — LISINOPRIL 5 MG PO TABS
5.0000 mg | ORAL_TABLET | Freq: Every day | ORAL | Status: DC
Start: 2017-10-28 — End: 2017-10-30
  Administered 2017-10-28 – 2017-10-30 (×3): 5 mg via ORAL
  Filled 2017-10-28 (×3): qty 1

## 2017-10-28 MED ORDER — INSULIN ASPART 100 UNIT/ML ~~LOC~~ SOLN
0.0000 [IU] | Freq: Three times a day (TID) | SUBCUTANEOUS | Status: DC
  Administered 2017-10-28: 3 [IU] via SUBCUTANEOUS
  Administered 2017-10-29: 09:00:00 2 [IU] via SUBCUTANEOUS
  Administered 2017-10-29: 17:00:00 11 [IU] via SUBCUTANEOUS
  Administered 2017-10-30: 3 [IU] via SUBCUTANEOUS
  Filled 2017-10-28 (×4): qty 1

## 2017-10-28 MED ORDER — SODIUM CHLORIDE 0.9 % IV SOLN
INTRAVENOUS | Status: DC
  Administered 2017-10-28: 08:00:00 via INTRAVENOUS

## 2017-10-28 MED ORDER — ALPRAZOLAM 0.5 MG PO TABS: 0.5000 mg | ORAL_TABLET | Freq: Two times a day (BID) | ORAL | Status: DC | PRN

## 2017-10-28 MED ORDER — HYDROMORPHONE HCL 1 MG/ML IJ SOLN
0.5000 mg | INTRAMUSCULAR | Status: DC | PRN
  Administered 2017-10-28 – 2017-10-29 (×11): 0.5 mg via INTRAVENOUS
  Filled 2017-10-28 (×11): qty 1

## 2017-10-28 MED ORDER — INSULIN ASPART 100 UNIT/ML ~~LOC~~ SOLN
0.0000 [IU] | Freq: Every day | SUBCUTANEOUS | Status: DC
  Administered 2017-10-28 – 2017-10-29 (×2): 2 [IU] via SUBCUTANEOUS
  Filled 2017-10-28 (×2): qty 1

## 2017-10-28 MED ORDER — INSULIN GLARGINE 100 UNIT/ML ~~LOC~~ SOLN
24.0000 [IU] | Freq: Every day | SUBCUTANEOUS | Status: DC
  Filled 2017-10-28: qty 0.24

## 2017-10-28 MED ORDER — SODIUM CHLORIDE 0.9 % IV BOLUS (SEPSIS)
1000.0000 mL | Freq: Once | INTRAVENOUS | Status: AC
  Administered 2017-10-28: 1000 mL via INTRAVENOUS

## 2017-10-28 MED ORDER — KETOROLAC TROMETHAMINE 30 MG/ML IJ SOLN
60.0000 mg | Freq: Once | INTRAMUSCULAR | Status: AC
  Administered 2017-10-28: 60 mg via INTRAVENOUS
  Filled 2017-10-28: qty 2

## 2017-10-28 MED ORDER — HEPARIN SODIUM (PORCINE) 5000 UNIT/ML IJ SOLN: 5000.0000 [IU] | Freq: Three times a day (TID) | INTRAMUSCULAR | Status: DC

## 2017-10-28 MED ORDER — FAMOTIDINE IN NACL 20-0.9 MG/50ML-% IV SOLN
20.0000 mg | Freq: Once | INTRAVENOUS | Status: AC
  Administered 2017-10-28: 20 mg via INTRAVENOUS
  Filled 2017-10-28: qty 50

## 2017-10-28 NOTE — Progress Notes (Signed)
Admitted this morning for DKA, patient DKA resolved, anion gap is closed.  Reviewed medical records, lab data. 1.  DKA: Anion gap is closed.  Transition from insulin drip to subcu basal insulin, NovoLog correction scale.  Add Lantus 24 units, NovoLog 3 units 3 times daily with meals, NovoLog 0-9 units 3 times daily, NovoLog 0-5 units nightly as per diabetic nurse recommendation.  #2 ADHD: Continue Adderall, Celexa. Time spent 15 minutes.

## 2017-10-28 NOTE — ED Notes (Signed)
Pt given 2 cups of water to drink per request.

## 2017-10-28 NOTE — Progress Notes (Signed)
Pt transferred from ICU via wheelchair. Pt  A&O. Oriented to room, callbell, telephone.

## 2017-10-28 NOTE — Progress Notes (Signed)
Inpatient Diabetes Program Recommendations  AACE/ADA: New Consensus Statement on Inpatient Glycemic Control (2015)  Target Ranges:  Prepandial:   less than 140 mg/dL      Peak postprandial:   less than 180 mg/dL (1-2 hours)      Critically ill patients:  140 - 180 mg/dL   Lab Results  Component Value Date   GLUCAP 431 (H) 10/28/2017  A1C 8.7% on 03/16/17   Review of Glycemic Control  Results for Jackson Miller, Jackson (MRN [id number]) as of 10/28/2017 08:12  Ref. Range 10/28/2017 07:51  Glucose-Capillary Latest Ref Range: 65 - 99 mg/dL 409431 (H)    Diabetes history: Type 1, since age 89- last seen by primary care on 10/13/17- did not go to endocrinology when referred    *A1C pending  Patient ran out of Toujeo about a week ago and Novolog yesterday.   Outpatient Diabetes medications: Novolog 5 units tid (plus sliding scale), Toujeo 30 units at 6am  Current orders for Inpatient glycemic control: IV insulin with DKA order set  Inpatient Diabetes Program Recommendations: When anion gap closed, CO2 >20 transition off insulin gtt- ideally, CBG should be 140-180 mg/dl x 4 hours before administering basal insulin.  RN- discontinue insulin drip 2 hours after subcutaneous basal insulin given and simultaneously give Novolog correction scale.  Consider transition using Lantus 24 units, add Novolog 3 units tid with meals, Novolog 0-9 units tid and Novolog 0-5 units qhs  Susette RacerJulie Deshayla Empson, RN, [place], [place], CDE Diabetes Coordinator Inpatient Diabetes Program  [phone] (Team Pager) [phone] [facility] Office) 10/28/2017 8:10 AM

## 2017-10-28 NOTE — ED Provider Notes (Signed)
[facility] Emergency Department Provider Note   ____________________________________________   First MD Initiated Contact with Patient 10/28/17 [phone]     (approximate)  I have reviewed the triage vital signs and the nursing notes.   HISTORY  Chief Complaint Hyperglycemia    HPI Jackson Miller is a 37 y.o. male who presents to the ED from home via EMS with a chief complaint of hyperglycemia, abdominal pain and vomiting.  Patient is a type I diabetic with prior history of DKA.  Per records, he is noncompliant.  Patient tells me he has "misplaced" his NovoLog pens.  Complains of a 1 day history of epigastric abdominal pain and vomiting.  Denies associated fever, chills, chest pain, shortness of breath, dysuria, diarrhea.  Denies recent travel or trauma.   Past Medical History:  Diagnosis Date  . Diabetes mellitus without complication [facility])     Patient Active Problem List   Diagnosis Date Noted  . DKA (diabetic ketoacidoses) (HCC) 10/28/2017    Past Surgical History:  Procedure Laterality Date  . CATARACT EXTRACTION Left   . CATARACT EXTRACTION Left     Prior to Admission medications   Medication Sig Start Date End Date Taking? Authorizing Provider  ALPRAZolam (XANAX) 0.25 MG tablet TAKE 1 TABLET BY MOUTH EVERY DAY AT NIGHT AS NEEDED 10/13/17  Yes [provider]  amphetamine-dextroamphetamine (ADDERALL) 20 MG tablet Take 20 mg by mouth daily. 10/21/17  Yes [provider]  citalopram (CELEXA) 20 MG tablet Take 20 mg by mouth daily.  10/13/17  Yes [provider]  lisinopril (PRINIVIL,ZESTRIL) 5 MG tablet TAKE 1 TABLET BY MOUTH EVERY DAY 01/13/17  Yes [provider]  NOVOLOG FLEXPEN 100 UNIT/ML FlexPen INJECT 5 UNITS INTO THE SKIN 3 TIMES DAILY BEFORE MEALS. 10/09/17  Yes [provider]  TOUJEO SOLOSTAR 300 UNIT/ML SOPN INJECT 0.1 ML (30 UNITS TOTAL) UNDER THE SKIN DAILY AT 0600. 07/29/17  Yes [provider]  prochlorperazine (COMPAZINE) 10 MG tablet Take 1 tablet (10 mg total) by mouth every 8 (eight) hours as needed for nausea or vomiting. 07/27/16 07/27/17  Rebecka Apley, MD    Allergies Reglan [metoclopramide]  No family history on file.  Social History Social History   Tobacco Use  . Smoking status: Former Games developer  . Smokeless tobacco: Never Used  Substance Use Topics  . Alcohol use: No  . Drug use: Yes    Frequency: 0.2 times per week    Types: Marijuana    Review of Systems   Constitutional: No fever/chills. Eyes: No visual changes. ENT: No sore throat. Cardiovascular: Denies chest pain. Respiratory: Denies shortness of breath. Gastrointestinal: Positive for abdominal pain, nausea and vomiting.  No diarrhea.  No constipation. Genitourinary: Negative for dysuria. Musculoskeletal: Negative for back pain. Skin: Negative for rash. Neurological: Negative for headaches, focal weakness or numbness.   ____________________________________________   PHYSICAL EXAM:  VITAL SIGNS: ED Triage Vitals  Enc Vitals Group     BP 10/28/17 0423 140/73     Pulse Rate 10/28/17 0423 82     Resp 10/28/17 0423 19     Temp 10/28/17 0423 (!) 97.4 F (36.3 C)     Temp Source 10/28/17 0423 Oral     SpO2 10/28/17 0423 100 %     Weight 10/28/17 0424 180 lb (81.6 kg)     Height 10/28/17 0424 5\' 8"  (1.727 m)     Head Circumference --      Peak Flow --  Pain Score 10/28/17 0424 10     Pain Loc --      Pain Edu? --      Excl. in GC? --     Constitutional: Alert and oriented. Well appearing and in moderate acute distress. Eyes: Conjunctivae are normal. PERRL. EOMI. Head: Atraumatic. Nose: No congestion/rhinnorhea. Mouth/Throat: Mucous membranes are moist.  Oropharynx non-erythematous. Neck: No stridor.   Cardiovascular: Normal rate, regular rhythm. Grossly normal heart sounds.  Good peripheral circulation. Respiratory: Normal respiratory effort.  No  retractions. Lungs CTAB. Gastrointestinal: Soft and minimally tender to palpation epigastrium without rebound or guarding. No distention. No abdominal bruits. No CVA tenderness. Musculoskeletal: No lower extremity tenderness nor edema.  No joint effusions. Neurologic:  Normal speech and language. No gross focal neurologic deficits are appreciated.  Skin:  Skin is warm, dry and intact. No rash noted. Psychiatric: Mood and affect are normal. Speech and behavior are normal.  ____________________________________________   LABS (all labs ordered are listed, but only abnormal results are displayed)  Labs Reviewed  CBC WITH DIFFERENTIAL/PLATELET - Abnormal; Notable for the following components:      Result Value   MCHC 31.5 (*)    RDW 16.7 (*)    Neutro Abs 6.9 (*)    Lymphs Abs 0.9 (*)    All other components within normal limits  COMPREHENSIVE METABOLIC PANEL - Abnormal; Notable for the following components:   Sodium 127 (*)    Potassium 5.4 (*)    Chloride 90 (*)    CO2 20 (*)    Glucose, Bld 927 (*)    BUN 31 (*)    Creatinine, Ser 1.59 (*)    Alkaline Phosphatase 176 (*)    Total Bilirubin 2.0 (*)    GFR calc non Af Amer 55 (*)    Anion gap 17 (*)    All other components within normal limits  URINALYSIS, COMPLETE (UACMP) WITH MICROSCOPIC - Abnormal; Notable for the following components:   Color, Urine COLORLESS (*)    APPearance CLEAR (*)    Glucose, UA >=500 (*)    Ketones, ur 20 (*)    All other components within normal limits  BLOOD GAS, VENOUS - Abnormal; Notable for the following components:   Acid-base deficit 4.2 (*)    All other components within normal limits  BETA-HYDROXYBUTYRIC ACID - Abnormal; Notable for the following components:   Beta-Hydroxybutyric Acid 4.40 (*)    All other components within normal limits  MRSA PCR SCREENING  CULTURE, BLOOD (ROUTINE X 2)  CULTURE, BLOOD (ROUTINE X 2)  HIV ANTIBODY (ROUTINE TESTING)  BASIC METABOLIC PANEL  BASIC  METABOLIC PANEL  BASIC METABOLIC PANEL  BASIC METABOLIC PANEL  CBC  HEMOGLOBIN A1C   ____________________________________________  EKG  ED ECG REPORT I, SUNG,JADE J, the attending physician, personally viewed and interpreted this ECG.   Date: 10/28/2017  EKG Time: 0523  Rate: 80  Rhythm: normal EKG, normal sinus rhythm  Axis: Normal  Intervals:none  ST&T Change: Nonspecific  ____________________________________________  RADIOLOGY  ED MD interpretation: None  Official radiology report(s): No results found.  ____________________________________________   PROCEDURES  Procedure(s) performed: None  Procedures  Critical Care performed: Yes, see critical care note(s)   CRITICAL CARE Performed by: Irean Hong   Total critical care time: 30 minutes  Critical care time was exclusive of separately billable procedures and treating other patients.  Critical care was necessary to treat or prevent imminent or life-threatening deterioration.  Critical care was time spent personally by  me on the following activities: development of treatment plan with patient and/or surrogate as well as nursing, discussions with consultants, evaluation of patient's response to treatment, examination of patient, obtaining history from patient or surrogate, ordering and performing treatments and interventions, ordering and review of laboratory studies, ordering and review of radiographic studies, pulse oximetry and re-evaluation of patient's condition.  ____________________________________________   INITIAL IMPRESSION / ASSESSMENT AND PLAN / ED COURSE  As part of my medical decision making, I reviewed the following data within the electronic MEDICAL RECORD NUMBER Nursing notes reviewed and incorporated, Labs reviewed, EKG interpreted, Old chart reviewed, Discussed with admitting physician and Notes from prior ED visits.   37 year old male with type 1 diabetes and history of medical noncompliance  who presents with hyperglycemia, nausea and vomiting.  Differential diagnosis includes but is not limited to DKA, infectious, metabolic etiologies, etc.  Laboratory results remarkable for hyperglycemia, hyponatremia, elevated anion gap.  Will continue IV fluid resuscitation, initiate insulin drip and discuss with hospitalist to evaluate patient in the emergency department for admission.      ____________________________________________   FINAL CLINICAL IMPRESSION(S) / ED DIAGNOSES  Final diagnoses:  Hyperglycemia  Diabetic ketoacidosis without coma associated with type 1 diabetes mellitus [facility](HCC)     ED Discharge Orders    None       Note:  This document was prepared using Dragon voice recognition software and may include unintentional dictation errors.    Irean HongSung, Jade J, MD 10/28/17 [phone]

## 2017-10-28 NOTE — ED Triage Notes (Signed)
Pt to the er for hyperglycemia. Pt has novolog pens issued to him as needed. Pt takes multiple insulin. Pt has been out for 1 week. Novolog has been out since yesterday.

## 2017-10-28 NOTE — ED Notes (Addendum)
Pt to the ER for pain all over r/t hyperglycemia. Pt has been out of his tojeo x 1 week. Pt has been out of his novolog since yesterday. Pt does not check his blood sugar at home. Pt has been drinking fluids to try to keep his blood sugar down. Pthas pain in the back of the neck as well.

## 2017-10-28 NOTE — Consult Note (Signed)
Reason for Consult: Joint pain  Referring Physician: Dr. Dimple CaseySimonds  Jackson Miller   HPI: 37 year old white male.  Does some farm work and odd jobs Long-standing diabetes since age 37.  Insulin-dependent. For the last 3-4 years he has had joint pain.  Pain in his right ankle.  Sometimes will swell.  Had prior injury with calcaneal fracture.  Is not been told that he has Charcot joint Hands will swell episodically.  Sometimes right hand than left.  May last for about a week.  May have had prior injury to right hand.  Prior workup showed negative rheumatoid factor negative ANA and negative Lyme titers,uric acid 6.8 Usually takes Tylenol or ibuprofen.  Not sure that it helps.  Sometimes pain is exquisite Recent pain in the shoulder.  Had to have injection.  He feels like he has some inflammation that sort of runs throughout him then leaves. Brought on by stress  He is concerned about a tick bite of 3-4 years ago.  Never had significant erythema or redness.  About 6 months after that had whelps.  Lyme titer in 17 was unremarkable.  No history of podagra.  No history of kidney stones.  Takes Xanax and amitriptyline.  Occasional marijuana. Recent admission with DKA.  Ran out of insulin.  No recent rash fever or nausea vomiting or abdominal pain.  No shortness of breath.  PMH: Restless legs.  DKA.  Diabetes long-standing.  SURGICAL HISTORY: No joint surgeries  Family History: No rheumatoid arthritis or gout  Social History: No significant alcohol.  Occasional marijuana.  Prior drug screen showed cannabis  Allergies:  Allergies  Allergen Reactions  . Reglan [Metoclopramide] Other (See Comments)    shakes    Medications:  Scheduled: . amphetamine-dextroamphetamine  20 mg Oral Daily  . citalopram  20 mg Oral Daily  . heparin  5,000 Units Subcutaneous Q8H  . lisinopril  5 mg Oral Daily        ROS: As above.  No fever or rash.   PHYSICAL EXAM: Blood pressure 117/70, pulse 81,  temperature 97.8 F (36.6 C), temperature source Oral, resp. rate 19, height 5\' 8"  (1.727 m), weight 81.6 kg (180 lb), SpO2 98 %. Mildly anxious male.  Answers questions appropriately.  Generally moves all of his joints appropriately when answering questions were moving around the bed.  No grimacing at that point.  Sclera clear.  Oropharynx clear.  Clear chest.  No thyromegaly.  Regular rhythm.  Nontender abdomen.  Lower extremity has a few excoriations and a few small red macules and papules.  He said there is been there for years.  Does not itch.  Musculoskeletal: Good range of motion neck and shoulders.  He grimaces with moving his elbow but has full extension with no synovitis.  He grimaces when touching his right fourth and fifth MCP but has no significant synovitis.  Mild third MCP enlargement.  PIPs are mildly tender.  Wrists creates grimace with flexion and extension bilaterally.  No triggering.  Positive prayer sign.  Positive Tinel's.  Hips move well.  Knees without effusions.  Decreased range of motion of right midfoot and ankle.  MTPs nontender left ankle moves well Neurologic positive Tinel's mild decreased pin median nerve distribution.  Trace ankle jerks  Assessment: Episodic migrating joint pain.  Atypical.  Now 3-4 years in duration.  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  Cannot completely rule out Charcot joint of the right midfoot though prior film in 2017 was negative for Charcot  Component of carpal tunnel  Pain out of proportion to exam  Recommendations: Recommend bilateral hand films.  Rule out rheumatoid, CPPD, old hand injury  recommend adding anti-CCP antibody to rheumatoid factor which has already been drawn Would like to see him back in the office to follow up. Would not add any medicines at this time Will arrange  hand therapy as outpatient  Kandyce Rud 10/28/2017, 1:49 PM

## 2017-10-28 NOTE — Plan of Care (Signed)
  Not Progressing Pain Managment: General experience of comfort will improve 10/28/2017 2010 - Not Progressing by Stefan Churchogers, Prosper Paff M, RN

## 2017-10-28 NOTE — H&P (Signed)
[facility] [place] [facility]   PATIENT NAME: Jackson Miller    MR#:  [id number]  DATE OF BIRTH:  17-Aug-1981  DATE OF ADMISSION:  10/28/2017  PRIMARY CARE PHYSICIAN: Patient, No Pcp Per   REQUESTING/REFERRING PHYSICIAN:   CHIEF COMPLAINT:   Chief Complaint  Patient presents with  . Hyperglycemia    HISTORY OF PRESENT ILLNESS: Jackson Miller  is a 37 y.o. male with a known history of type 1 diabetes, presented to the emergency room with nausea, emesis, abdominal pain, not feeling well, no Toujeo for 1 week, in the emergency room patient was found to be in acute DKA with blood sugar greater than 900, patient evaluated emergency room, no apparent distress, patient now been admitted to the stepdown unit on DKA protocol.  PAST MEDICAL HISTORY:   Past Medical History:  Diagnosis Date  . Diabetes mellitus without complication (HCC)     PAST SURGICAL HISTORY:  Past Surgical History:  Procedure Laterality Date  . CATARACT EXTRACTION Left   . CATARACT EXTRACTION Left     SOCIAL HISTORY:  Social History   Tobacco Use  . Smoking status: Former Games developer  . Smokeless tobacco: Never Used  Substance Use Topics  . Alcohol use: No    FAMILY HISTORY: HTN  DRUG ALLERGIES:  Allergies  Allergen Reactions  . Reglan [Metoclopramide] Other (See Comments)    shakes    REVIEW OF SYSTEMS:   CONSTITUTIONAL: No fever,+ fatigue, weakness.  EYES: No blurred or double vision.  EARS, NOSE, AND THROAT: No tinnitus or ear pain.  RESPIRATORY: No cough, shortness of breath, wheezing or hemoptysis.  CARDIOVASCULAR: No chest pain, orthopnea, edema.  GASTROINTESTINAL: + nausea, vomiting, abdominal pain.  GENITOURINARY: No dysuria, hematuria.  ENDOCRINE: No polyuria, nocturia,  HEMATOLOGY: No anemia, easy bruising or bleeding SKIN: No rash or lesion. MUSCULOSKELETAL: No joint pain or arthritis.   NEUROLOGIC: No tingling, numbness, weakness.  PSYCHIATRY: No anxiety or  depression.   MEDICATIONS AT HOME:  Prior to Admission medications   Medication Sig Start Date End Date Taking? Authorizing Provider  ALPRAZolam (XANAX) 0.25 MG tablet TAKE 1 TABLET BY MOUTH EVERY DAY AT NIGHT AS NEEDED 10/13/17  Yes [provider]  amphetamine-dextroamphetamine (ADDERALL) 20 MG tablet Take 20 mg by mouth daily. 10/21/17  Yes [provider]  citalopram (CELEXA) 20 MG tablet Take 20 mg by mouth daily.  10/13/17  Yes [provider]  lisinopril (PRINIVIL,ZESTRIL) 5 MG tablet TAKE 1 TABLET BY MOUTH EVERY DAY 01/13/17  Yes [provider]  NOVOLOG FLEXPEN 100 UNIT/ML FlexPen INJECT 5 UNITS INTO THE SKIN 3 TIMES DAILY BEFORE MEALS. 10/09/17  Yes [provider]  TOUJEO SOLOSTAR 300 UNIT/ML SOPN INJECT 0.1 ML (30 UNITS TOTAL) UNDER THE SKIN DAILY AT 0600. 07/29/17  Yes [provider]  prochlorperazine (COMPAZINE) 10 MG tablet Take 1 tablet (10 mg total) by mouth every 8 (eight) hours as needed for nausea or vomiting. 07/27/16 07/27/17  Rebecka Apley, MD      PHYSICAL EXAMINATION:   VITAL SIGNS: Blood pressure 115/62, pulse 85, temperature (!) 97.4 F (36.3 C), temperature source Oral, resp. rate (!) 25, height 5\' 8"  (1.727 m), weight 81.6 kg (180 lb), SpO2 99 %.  GENERAL:  37 y.o.-year-old patient lying in the bed with no acute distress.  EYES: Pupils equal, round, reactive to light and accommodation. No scleral icterus. Extraocular muscles intact.  HEENT: Head atraumatic, normocephalic. Oropharynx and nasopharynx clear.  NECK:  Supple,  no jugular venous distention. No thyroid enlargement, no tenderness.  LUNGS: Normal breath sounds bilaterally, no wheezing, rales,rhonchi or crepitation. No use of accessory muscles of respiration.  CARDIOVASCULAR: S1, S2 normal. No murmurs, rubs, or gallops.  ABDOMEN: Soft, nontender, nondistended. Bowel sounds present. No organomegaly or mass.  EXTREMITIES: No pedal edema, cyanosis, or  clubbing.  NEUROLOGIC: Cranial nerves II through XII are intact. Muscle strength 5/5 in all extremities. Sensation intact. Gait not checked.  PSYCHIATRIC: The patient is alert and oriented x 3.  SKIN: No obvious rash, lesion, or ulcer.   LABORATORY PANEL:   CBC Recent Labs  Lab 10/28/17 0426  WBC 8.3  HGB 13.2  HCT 42.0  PLT 322  MCV 91.4  MCH 28.8  MCHC 31.5*  RDW 16.7*  LYMPHSABS 0.9*  MONOABS 0.3  EOSABS 0.1  BASOSABS 0.0   ------------------------------------------------------------------------------------------------------------------  Chemistries  Recent Labs  Lab 10/28/17 0426  NA 127*  K 5.4*  CL 90*  CO2 20*  GLUCOSE 927*  BUN 31*  CREATININE 1.59*  CALCIUM 9.4  AST 29  ALT 23  ALKPHOS 176*  BILITOT 2.0*   ------------------------------------------------------------------------------------------------------------------ estimated creatinine clearance is 63.3 mL/min (A) (by C-G formula based on SCr of 1.59 mg/dL (H)). ------------------------------------------------------------------------------------------------------------------ No results for input(s): TSH, T4TOTAL, T3FREE, THYROIDAB in the last 72 hours.  Invalid input(s): FREET3   Coagulation profile No results for input(s): INR, PROTIME in the last 168 hours. ------------------------------------------------------------------------------------------------------------------- No results for input(s): DDIMER in the last 72 hours. -------------------------------------------------------------------------------------------------------------------  Cardiac Enzymes No results for input(s): CKMB, TROPONINI, MYOGLOBIN in the last 168 hours.  Invalid input(s): CK ------------------------------------------------------------------------------------------------------------------ Invalid input(s):  POCBNP  ---------------------------------------------------------------------------------------------------------------  Urinalysis    Component Value Date/Time   COLORURINE COLORLESS (A) 10/28/2017 0426   APPEARANCEUR CLEAR (A) 10/28/2017 0426   LABSPEC 1.021 10/28/2017 0426   PHURINE 6.0 10/28/2017 0426   GLUCOSEU >=500 (A) 10/28/2017 0426   HGBUR NEGATIVE 10/28/2017 0426   BILIRUBINUR NEGATIVE 10/28/2017 0426   KETONESUR 20 (A) 10/28/2017 0426   PROTEINUR NEGATIVE 10/28/2017 0426   NITRITE NEGATIVE 10/28/2017 0426   LEUKOCYTESUR NEGATIVE 10/28/2017 0426     RADIOLOGY: No results found.  EKG: Orders placed or performed during the hospital encounter of 10/28/17  . EKG 12-Lead  . EKG 12-Lead    IMPRESSION AND PLAN: 1 Acute diabetic ketoacidosis Secondary to type 1 diabetes, lack of insulin for 1 week Admit to ICU on our DKA protocol, intensivist notified, IV fluids for rehydration  2 chronic type 1 diabetes myelitis Plan of care as stated above  3 chronic ADHD Stable Continue home regiment  4 chronic general anxiety disorder, unspecified Stable Continue home regiment  5 acute hyperkalemia, hyponatremia, hypochloremia Replete with IV fluids, on IV insulin, BMP every 4 hours per protocol  All the records are reviewed and case discussed with ED provider. Management plans discussed with the patient, family and they are in agreement.  CODE STATUS: Code Status History    This patient does not have a recorded code status. Please follow your organizational policy for patients in this situation.       TOTAL TIME TAKING CARE OF THIS PATIENT: 45minutes.    Evelena AsaMontell D Salary M.D on 10/28/2017   Between 7am to 6pm - Pager - [phone]  After 6pm go to [email or web address] - password [email or web address]  [facility]  Office  [phone]  CC: Primary care physician; Patient, No Pcp Per   Note: This dictation was prepared with Dragon dictation along with  smaller phrase technology. Any transcriptional errors that result from this process are unintentional.

## 2017-10-28 NOTE — Progress Notes (Signed)
Patient has transitioned from insulin infusion to sliding scale insulin.  His chief complaint this morning was severe arthritis in both hands.  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  He has a rash on both lower extremities.  I placed order for rheumatology consult and he has been seen by Dr. Gavin PottersKernodle.  He is now ready for his for to MedSurg floor.  After transfer, PCCM will sign off. Please call if we can be of further assistance    Jackson Fischeravid Dineen Conradt, MD PCCM service Mobile [phone] Pager [phone] 10/28/2017 3:10 PM

## 2017-10-28 NOTE — ED Notes (Signed)
Attempt to call report rejected. Charge RN on unit has not approved the admission yet. Left number for call back.

## 2017-10-28 NOTE — Progress Notes (Signed)
37 yo male admitted with DKA after misplacing his Novolog pen currently on insulin gtt.  Pt alert and oriented, follows commands, lungs clear throughout, nsr on cardiac monitor.  Will remain in stepdown unit until anion gap closed, CO2 >20, and transitioned off insulin gtt.  Sonda Rumbleana Maury Bamba, AGNP  Pulmonary/Critical Care Pager [phone] (please enter 7 digits) PCCM Consult Pager [phone] (please enter 7 digits)

## 2017-10-29 ENCOUNTER — Inpatient Hospital Stay: Payer: Medicaid Other

## 2017-10-29 LAB — ANA COMPREHENSIVE PANEL
Anti JO-1: <0.2 AI (ref 0.0–0.9)
Centromere Ab Screen: <0.2 AI (ref 0.0–0.9)
Chromatin Ab SerPl-aCnc: <0.2 AI (ref 0.0–0.9)
ENA SM AB SER-ACNC: 0.2 AI (ref 0.0–0.9)
SSA (Ro) (ENA) Antibody, IgG: 0.2 AI (ref 0.0–0.9)
SSB (La) (ENA) Antibody, IgG: <0.2 AI (ref 0.0–0.9)

## 2017-10-29 LAB — GLUCOSE, CAPILLARY
GLUCOSE-CAPILLARY: 338 mg/dL — AB (ref 65–99)
GLUCOSE-CAPILLARY: 66 mg/dL (ref 65–99)
GLUCOSE-CAPILLARY: 50 mg/dL — AB (ref 65–99)
GLUCOSE-CAPILLARY: 84 mg/dL (ref 65–99)
GLUCOSE-CAPILLARY: 213 mg/dL — AB (ref 65–99)
GLUCOSE-CAPILLARY: 204 mg/dL — AB (ref 65–99)
Glucose-Capillary: 351 mg/dL — ABNORMAL HIGH (ref 65–99)
Glucose-Capillary: 257 mg/dL — ABNORMAL HIGH (ref 65–99)
Glucose-Capillary: 137 mg/dL — ABNORMAL HIGH (ref 65–99)
Glucose-Capillary: 62 mg/dL — ABNORMAL LOW (ref 65–99)
Glucose-Capillary: 217 mg/dL — ABNORMAL HIGH (ref 65–99)
Glucose-Capillary: 344 mg/dL — ABNORMAL HIGH (ref 65–99)

## 2017-10-29 LAB — BASIC METABOLIC PANEL
Anion gap: 10 (ref 5–15)
BUN: 25 mg/dL — AB (ref 6–20)
CHLORIDE: 100 mmol/L — AB (ref 101–111)
CO2: 22 mmol/L (ref 22–32)
CREATININE: 1.4 mg/dL — AB (ref 0.61–1.24)
Calcium: 8.3 mg/dL — ABNORMAL LOW (ref 8.9–10.3)
GFR calc Af Amer: >60 mL/min (ref >60)
GFR calc non Af Amer: >60 mL/min (ref >60)
Glucose, Bld: 357 mg/dL — ABNORMAL HIGH (ref 65–99)
Potassium: 5.3 mmol/L — ABNORMAL HIGH (ref 3.5–5.1)
SODIUM: 132 mmol/L — AB (ref 135–145)

## 2017-10-29 LAB — HIV ANTIBODY (ROUTINE TESTING W REFLEX): HIV Screen 4th Generation wRfx: NONREACTIVE

## 2017-10-29 LAB — RHEUMATOID FACTOR: Rhuematoid fact SerPl-aCnc: <10 IU/mL (ref 0.0–13.9)

## 2017-10-29 MED ORDER — INSULIN ASPART 100 UNIT/ML ~~LOC~~ SOLN
3.0000 [IU] | Freq: Three times a day (TID) | SUBCUTANEOUS | Status: DC
  Administered 2017-10-29 – 2017-10-30 (×2): 3 [IU] via SUBCUTANEOUS
  Filled 2017-10-29 (×2): qty 1

## 2017-10-29 MED ORDER — HYDROMORPHONE HCL 1 MG/ML IJ SOLN
0.5000 mg | Freq: Four times a day (QID) | INTRAMUSCULAR | Status: DC | PRN
  Administered 2017-10-29 – 2017-10-30 (×3): 0.5 mg via INTRAVENOUS
  Filled 2017-10-29 (×4): qty 1

## 2017-10-29 MED ORDER — INSULIN ASPART 100 UNIT/ML ~~LOC~~ SOLN
14.0000 [IU] | Freq: Once | SUBCUTANEOUS | Status: AC
  Administered 2017-10-29: 14 [IU] via SUBCUTANEOUS
  Filled 2017-10-29: qty 1

## 2017-10-29 MED ORDER — ACETAMINOPHEN 325 MG PO TABS
650.0000 mg | ORAL_TABLET | Freq: Four times a day (QID) | ORAL | Status: DC | PRN
  Administered 2017-10-29 (×4): 650 mg via ORAL
  Filled 2017-10-29 (×4): qty 2

## 2017-10-29 MED ORDER — INSULIN ASPART 100 UNIT/ML ~~LOC~~ SOLN: 5.0000 [IU] | Freq: Three times a day (TID) | SUBCUTANEOUS | Status: DC

## 2017-10-29 MED ORDER — HYDROMORPHONE HCL 2 MG PO TABS: 2.0000 mg | ORAL_TABLET | Freq: Once | ORAL | Status: DC

## 2017-10-29 MED ORDER — INSULIN GLARGINE 100 UNIT/ML ~~LOC~~ SOLN
24.0000 [IU] | Freq: Every day | SUBCUTANEOUS | Status: DC
  Administered 2017-10-30: 24 [IU] via SUBCUTANEOUS
  Filled 2017-10-29: qty 0.24

## 2017-10-29 MED ORDER — OXYCODONE-ACETAMINOPHEN 5-325 MG PO TABS
2.0000 | ORAL_TABLET | ORAL | Status: DC | PRN
  Administered 2017-10-29 – 2017-10-30 (×3): 2 via ORAL
  Filled 2017-10-29 (×4): qty 2

## 2017-10-29 MED ORDER — INSULIN GLARGINE 100 UNIT/ML ~~LOC~~ SOLN
27.0000 [IU] | Freq: Every day | SUBCUTANEOUS | Status: DC
  Administered 2017-10-29: 27 [IU] via SUBCUTANEOUS
  Filled 2017-10-29: qty 0.27

## 2017-10-29 NOTE — Progress Notes (Signed)
Notified MD of pt with blood sugar of 338. Temp elevated @ 99.7. Orders placed. Will continue to monitor and assess.

## 2017-10-29 NOTE — Consult Note (Signed)
Consult received for recent heel pain.Pt not seen. Pt has been evaluated by Rheumatology.  No acute issues on their exam D/W medicine and no redness/swelling/erythema to foot. Xray is normal to left foot.No concern for fracture or charcot. Suspect musculoskeletal type issue.  Can be managed outpt.   Pt to f/u in outpt clinic prn. Will sign off but re-consult if s/s worsen or change.

## 2017-10-29 NOTE — Progress Notes (Signed)
[facility] [place] at [facility]   PATIENT NAME: Jackson FenderStephen Miller    MR#:  [id number]  DATE OF BIRTH:  03/09/1981  SUBJECTIVE: Patient admitted for DKA, initially required insulin drip and was in ICU then off the insulin drip and transferred to the floor.  Today morning complains of left heel pain, also hand pains.  CHIEF COMPLAINT:   Chief Complaint  Patient presents with  . Hyperglycemia    REVIEW OF SYSTEMS:   ROS CONSTITUTIONAL: No fever, fatigue or weakness.  EYES: No blurred or double vision.  EARS, NOSE, AND THROAT: No tinnitus or ear pain.  RESPIRATORY: No cough, shortness of breath, wheezing or hemoptysis.  CARDIOVASCULAR: No chest pain, orthopnea, edema.  GASTROINTESTINAL: No nausea, vomiting, diarrhea or abdominal pain.  GENITOURINARY: No dysuria, hematuria.  ENDOCRINE: No polyuria, nocturia,  HEMATOLOGY: No anemia, easy bruising or bleeding SKIN: No rash or lesion. MUSCULOSKELETAL: No joint pain or arthritis.   NEUROLOGIC: No tingling, numbness, weakness.  PSYCHIATRY: No anxiety or depression.   DRUG ALLERGIES:   Allergies  Allergen Reactions  . Reglan [Metoclopramide] Other (See Comments)    shakes    VITALS:  Blood pressure 113/70, pulse (!) 107, temperature 99.7 F (37.6 C), temperature source Oral, resp. rate 18, height 5\' 8"  (1.727 m), weight 81.6 kg (180 lb), SpO2 93 %.  PHYSICAL EXAMINATION:  GENERAL:  37 y.o.-year-old patient lying in the bed, uncomfortable because of the heel pain.  EYES: Pupils equal, round, reactive to light and accommodation. No scleral icterus. Extraocular muscles intact.  HEENT: Head atraumatic, normocephalic. Oropharynx and nasopharynx clear.  NECK:  Supple, no jugular venous distention. No thyroid enlargement, no tenderness.  LUNGS: Normal breath sounds bilaterally, no wheezing, rales,rhonchi or crepitation. No use of accessory muscles of respiration.  CARDIOVASCULAR: S1, S2 normal. No murmurs, rubs,  or gallops.  ABDOMEN: Soft, nontender, nondistended. Bowel sounds present. No organomegaly or mass.  EXTREMITIES: No pedal edema, cyanosis, or clubbing.  NEUROLOGIC: Cranial nerves II through XII are intact. Muscle strength 5/5 in all extremities. Sensation intact. Gait not checked.  PSYCHIATRIC: The patient is alert and oriented x 3.  SKIN: No obvious rash, lesion, or ulcer.    LABORATORY PANEL:   CBC Recent Labs  Lab 10/28/17 0808  WBC 8.7  HGB 12.0*  HCT 36.7*  PLT 317   ------------------------------------------------------------------------------------------------------------------  Chemistries  Recent Labs  Lab 10/28/17 0426  10/29/17 0423  NA 127*   < > 132*  K 5.4*   < > 5.3*  CL 90*   < > 100*  CO2 20*   < > 22  GLUCOSE 927*   < > 357*  BUN 31*   < > 25*  CREATININE 1.59*   < > 1.40*  CALCIUM 9.4   < > 8.3*  AST 29  --   --   ALT 23  --   --   ALKPHOS 176*  --   --   BILITOT 2.0*  --   --    < > = values in this interval not displayed.   ------------------------------------------------------------------------------------------------------------------  Cardiac Enzymes No results for input(s): TROPONINI in the last 168 hours. ------------------------------------------------------------------------------------------------------------------  RADIOLOGY:  No results found.  EKG:   Orders placed or performed during the hospital encounter of 10/28/17  . EKG 12-Lead  . EKG 12-Lead    ASSESSMENT AND PLAN:  #1 DKA: Resolved, anion gap closed.  Patient right now on Lantus 24 units, NovoLog 3 units 3 times daily.  Hypoglycemia up to 350s last night.  But this morning around 60s.  So continue Lantus 24 units, NovoLog 3 units 2.   left heel pain likely heel spur.  Podiatry consult placed because of history of diabetes mellitus, uncontrolled with the DKA this admission. 3.  Bilateral hand pains: Seen by Dr. Gavin Potters.  X-ray of the both hands, add anti-CCP antibody  to blood work. 4.  Likely discharge home tomorrow.  Continue IV pain medicine, nausea medicine 5.  ADHD: Continue Adderall.      All the records are reviewed and case discussed with Care Management/Social Workerr. Management plans discussed with the patient, family and they are in agreement.  CODE STATUS: full  TOTAL TIME TAKING CARE OF THIS PATIENT: 35 minutes.   POSSIBLE D/C IN 1-2 DAYS, DEPENDING ON CLINICAL CONDITION.   Katha Hamming M.D on 10/29/2017 at 11:51 AM  Between 7am to 6pm - Pager - [phone]  After 6pm go to [email or web address] - password [email or web address]  [facility]  Office  [phone]  CC: Primary care physician; Patient, No Pcp Per   Note: This dictation was prepared with Dragon dictation along with smaller phrase technology. Any transcriptional errors that result from this process are unintentional.

## 2017-10-29 NOTE — Progress Notes (Signed)
Notified MD of blood sugar of 351. Order to recheck in 20 minutes.

## 2017-10-29 NOTE — Progress Notes (Signed)
Contacted MD re: pt complaining of pain. Pt stated percocet makes him feel bad however he decided to take percocet as currently available prn.

## 2017-10-30 LAB — GLUCOSE, CAPILLARY
GLUCOSE-CAPILLARY: 163 mg/dL — AB (ref 65–99)
Glucose-Capillary: 161 mg/dL — ABNORMAL HIGH (ref 65–99)
Glucose-Capillary: 171 mg/dL — ABNORMAL HIGH (ref 65–99)

## 2017-10-30 MED ORDER — MORPHINE SULFATE (PF) 2 MG/ML IV SOLN
INTRAVENOUS | Status: AC
  Filled 2017-10-30: qty 1

## 2017-10-30 MED ORDER — INSULIN ASPART 100 UNIT/ML ~~LOC~~ SOLN: 0.0000 [IU] | Freq: Three times a day (TID) | SUBCUTANEOUS | 11 refills | Status: DC

## 2017-10-30 MED ORDER — INSULIN ASPART 100 UNIT/ML ~~LOC~~ SOLN: 3.0000 [IU] | Freq: Three times a day (TID) | SUBCUTANEOUS | 11 refills | Status: DC

## 2017-10-30 MED ORDER — OXYCODONE-ACETAMINOPHEN 5-325 MG PO TABS: 2.0000 | ORAL_TABLET | ORAL | 0 refills | Status: DC | PRN

## 2017-10-30 MED ORDER — INSULIN ASPART 100 UNIT/ML ~~LOC~~ SOLN: 0.0000 [IU] | Freq: Every day | SUBCUTANEOUS | 11 refills | Status: AC

## 2017-10-30 MED ORDER — MORPHINE SULFATE (PF) 2 MG/ML IV SOLN
1.0000 mg | Freq: Once | INTRAVENOUS | Status: AC
  Administered 2017-10-30: 10:00:00 1 mg via INTRAVENOUS
  Filled 2017-10-30: qty 1

## 2017-10-30 NOTE — Progress Notes (Signed)
Blood sugar control, stable for discharge.  X-rays of the hands, x-ray of the left leg did not show any problem.  Patient still complains of right hand pain and requesting Percocet prescription and also needing morphine in the hospital.  We will give him information about pseudogout, patient can Follow-up with Dr. Gavin PottersKernodle as an outpatient for further evaluation.  And can also follow-up with Dr. Ether GriffinsFowler as an outpatient regarding possible left heel spur. Time spent 30 minutes.

## 2017-10-30 NOTE — Progress Notes (Signed)
Pt is being discharged today, discharge instructions were reviewed with the patient and he verified understanding. 3 paper prescriptions were given to him. All belongings packed and returned to him. He is currently waiting on his ride.

## 2017-10-30 NOTE — Discharge Instructions (Signed)
Pseudo gout information to patient

## 2017-11-02 LAB — CULTURE, BLOOD (ROUTINE X 2)
Culture: NO GROWTH
Culture: NO GROWTH
SPECIAL REQUESTS: ADEQUATE
Special Requests: ADEQUATE

## 2017-11-02 NOTE — Discharge Summary (Signed)
Jackson Miller, is a 37 y.o. male  DOB 03/10/1981  MRN [id number].  Admission date:  10/28/2017  Admitting Physician  Bertrum Sol, MD  Discharge Date:  11/02/2017   Primary MD  Patient, No Pcp Per  Recommendations for primary care physician for things to follow:   Follow-up with Dr. Gavin Potters in 2 weeks Follow-up with Dr. Gwyneth Revels in 2 weeks, patient is given phone numbers to make that appointment.   Admission Diagnosis  Hyperglycemia [R73.9] Diabetic ketoacidosis without coma associated with type 1 diabetes mellitus (HCC) [E10.10]   Discharge Diagnosis  Hyperglycemia [R73.9] Diabetic ketoacidosis without coma associated with type 1 diabetes mellitus (HCC) [E10.10]    Active Problems:   DKA (diabetic ketoacidoses) (HCC)      Past Medical History:  Diagnosis Date  . Diabetes mellitus without complication [facility])     Past Surgical History:  Procedure Laterality Date  . CATARACT EXTRACTION Left   . CATARACT EXTRACTION Left        History of present illness and  Hospital Course:     Kindly see H&P for history of present illness and admission details, please review complete Labs, Consult reports and Test reports for all details in brief  HPI  from the history and physical done on the day of admission 37 year old male patient with history of type 1 diabetes mellitus came in because of nausea, vomiting, abdominal pain and not feeling well, admitted to ICU because blood sugar was 900 and patient was in DKA.   Hospital Course  1 DKA, secondary to not taking Toujeo for 1 week.  Patient noted to have anion gap of 17, acute kidney injury with creatinine 1.59 and potassium 5.4, sodium 127.  Sugar was 927 when he came.  Admitted to.  Patient pH was 7.29 stepdown status.  Admitted and received IV insulin, aggressive IV  hydration.  Patient DKA resolved, anion gap decreased to 9, sodium improved to 136, creatinine down to 1.15.  Patient moved out of intensive care unit regular floor.  He tolerated the diet also.  Patient continued to do well, seen by diabetic nurse.  Patient advised to continue his home dose Toujeo 30 units daily..  But he received Lantus during the hospital stay.  He is started on NovoLog 3 units 3 times daily with meals.  Patient also has sliding scale coverage with 0-9 units 3 times daily, NovoLog 0-5 units nightly.  He told me that he is very knowledgeable on how to give NovoLog before extra carbs that he takes.  He also has told me that he has enough insulin but he needed needles which we give prescription.  2 history of ADHD: It is on Adderall, Celexa.  Patient can continue that. 3.  Bilateral from rheumatology.  Hand pains.  Patient seen by Dr. Gavin Potters x-rays of the both hands did not show any acute problem.  Patient can see him as an outpatient regarding evaluation of pseudogout patient had blood work for rheumatoid arthritis, CPPD, 4.  Left heel pain, patient had x-ray of the left hip to make sure he does not start quotes joint.  Patient left foot x-ray is normal without any acute problem.  Dr. Ether Griffins said he can see him in the office for possible steroid injection if needed.  Told the patient about the same.  Discharge the patient home in stable condition with limited prescription of narcotics.  I called the pharmacy and confirmed/clarified order Percocet 1 tablet every 6 hours as needed  for pain.   Discharge Condition: stable  Follow UP  Follow-up Information    Kandyce Rud., MD. Call in 1 week(s).   Specialty:  Rheumatology Contact information: [address] [facility] [place] [address] [phone]        Gwyneth Revels, DPM. Call in 1 week(s).   Specialty:  Podiatry Contact information: [address] [place]  [address] [phone]             Discharge Instructions  and  Discharge Medications      Allergies as of 10/30/2017      Reactions   Reglan [metoclopramide] Other (See Comments)   shakes      Medication List    STOP taking these medications   NOVOLOG FLEXPEN 100 UNIT/ML FlexPen Generic drug:  insulin aspart Replaced by:  insulin aspart 100 UNIT/ML injection     TAKE these medications   ALPRAZolam 0.25 MG tablet Commonly known as:  XANAX TAKE 1 TABLET BY MOUTH EVERY DAY AT NIGHT AS NEEDED   amphetamine-dextroamphetamine 20 MG tablet Commonly known as:  ADDERALL Take 20 mg by mouth daily.   citalopram 20 MG tablet Commonly known as:  CELEXA Take 20 mg by mouth daily.   insulin aspart 100 UNIT/ML injection Commonly known as:  novoLOG Inject 0-5 Units into the skin at bedtime. Replaces:  NOVOLOG FLEXPEN 100 UNIT/ML FlexPen   insulin aspart 100 UNIT/ML injection Commonly known as:  novoLOG Inject 0-15 Units into the skin 3 (three) times daily with meals.   insulin aspart 100 UNIT/ML injection Commonly known as:  novoLOG Inject 3 Units into the skin 3 (three) times daily with meals.   lisinopril 5 MG tablet Commonly known as:  PRINIVIL,ZESTRIL TAKE 1 TABLET BY MOUTH EVERY DAY   oxyCODONE-acetaminophen 5-325 MG tablet Commonly known as:  PERCOCET/ROXICET Take 2 tablets by mouth every 4 (four) hours as needed for moderate pain.   prochlorperazine 10 MG tablet Commonly known as:  COMPAZINE Take 1 tablet (10 mg total) by mouth every 8 (eight) hours as needed for nausea or vomiting.   TOUJEO SOLOSTAR 300 UNIT/ML Sopn Generic drug:  Insulin Glargine INJECT 0.1 ML (30 UNITS TOTAL) UNDER THE SKIN DAILY AT 0600.         Diet and Activity recommendation: See Discharge Instructions above   Consults obtained -rheumatology, intensivist, diabetic nurse, phone consult with Dr. Ether Griffins.   Major procedures and Radiology Reports - PLEASE review detailed and final  reports for all details, in brief -      Dg Hand 2 View Right  Result Date: 10/29/2017 CLINICAL DATA:  Bilateral hand pain since yesterday. Swelling across the MP joints. EXAM: RIGHT HAND - 2 VIEW COMPARISON:  None. FINDINGS: There is no evidence of fracture or dislocation. There is no evidence of arthropathy or other focal bone abnormality. Soft tissues are unremarkable. IMPRESSION: No acute osseous injury of the right hand. Electronically Signed   By: Elige Ko   On: 10/29/2017 13:34   Dg Hand Complete Left  Result Date: 10/29/2017 CLINICAL DATA:  Bilateral hand pain since yesterday. Swelling across the MP joints. EXAM: LEFT HAND - COMPLETE 3+ VIEW COMPARISON:  None. FINDINGS: There is no evidence of fracture or dislocation. There is no evidence of arthropathy or other focal bone abnormality. Soft tissues are unremarkable. IMPRESSION: No acute osseous injury of the left hand. Electronically Signed   By: Elige Ko   On: 10/29/2017 13:35   Dg Foot  Complete Left  Result Date: 10/29/2017 CLINICAL DATA:  Left heel pain EXAM: LEFT FOOT - COMPLETE 3+ VIEW COMPARISON:  None. FINDINGS: There is no evidence of fracture or dislocation. There is no evidence of arthropathy or other focal bone abnormality. Soft tissues are unremarkable. IMPRESSION: No acute osseous injury of the left foot. Electronically Signed   By: Elige KoHetal  Patel   On: 10/29/2017 13:35    Micro Results     Recent Results (from the past 240 hour(s))  MRSA PCR Screening     Status: None   Collection Time: 10/28/17  6:36 AM  Result Value Ref Range Status   MRSA by PCR NEGATIVE NEGATIVE Final    Comment:        The GeneXpert MRSA Assay (FDA approved for NASAL specimens only), is one component of a comprehensive MRSA colonization surveillance program. It is not intended to diagnose MRSA infection nor to guide or monitor treatment for MRSA infections. Performed at [facility], [address] [place], [place]  [address]   Culture, blood (routine x 2)     Status: None   Collection Time: 10/28/17  8:08 AM  Result Value Ref Range Status   Specimen Description BLOOD LEFT HAND  Final   Special Requests   Final    BOTTLES DRAWN AEROBIC AND ANAEROBIC Blood Culture adequate volume   Culture   Final    NO GROWTH 5 DAYS Performed at [facility], [address] [place], [place] [address]    Report Status 11/02/2017 FINAL  Final  Culture, blood (routine x 2)     Status: None   Collection Time: 10/28/17  8:19 AM  Result Value Ref Range Status   Specimen Description BLOOD RIGHT HAND  Final   Special Requests   Final    BOTTLES DRAWN AEROBIC AND ANAEROBIC Blood Culture adequate volume   Culture   Final    NO GROWTH 5 DAYS Performed at [facility], [address] [place], [place] [address]    Report Status 11/02/2017 FINAL  Final       Today   Subjective:   Jackson FenderStephen Miller today has no headache,no chest abdominal pain,no new weakness tingling or numbness, feels much better wants to go home today.   Objective:   Blood pressure 131/78, pulse (!) 109, temperature 98.1 F (36.7 C), temperature source Oral, resp. rate 18, height 5\' 8"  (1.727 m), weight 81.6 kg (180 lb), SpO2 97 %.  No intake or output data in the 24 hours ending 11/02/17 1126  Exam Awake Alert, Oriented x 3, No new F.N deficits, Normal affect [place].AT,PERRAL Supple Neck,No JVD, No cervical lymphadenopathy appriciated.  Symmetrical Chest wall movement, Good air movement bilaterally, CTAB RRR,No Gallops,Rubs or new Murmurs, No Parasternal Heave +ve B.Sounds, Abd Soft, Non tender, No organomegaly appriciated, No rebound -guarding or rigidity. No Cyanosis, Clubbing or edema, No new Rash or bruise  Data Review   CBC w Diff:  Lab Results  Component Value Date   WBC 8.7 10/28/2017   HGB 12.0 (L) 10/28/2017   HCT 36.7 (L) 10/28/2017   PLT 317 10/28/2017   LYMPHOPCT 11 10/28/2017   MONOPCT 4 10/28/2017    EOSPCT 2 10/28/2017   BASOPCT 0 10/28/2017    CMP:  Lab Results  Component Value Date   NA 132 (L) 10/29/2017   K 5.3 (H) 10/29/2017   CL 100 (L) 10/29/2017   CO2 22 10/29/2017   BUN 25 (H) 10/29/2017   CREATININE  1.40 (H) 10/29/2017   PROT 7.9 10/28/2017   ALBUMIN 3.5 10/28/2017   BILITOT 2.0 (H) 10/28/2017   ALKPHOS 176 (H) 10/28/2017   AST 29 10/28/2017   ALT 23 10/28/2017  .   Total Time in preparing paper work, data evaluation and todays exam - 35 minutes  Katha Hamming M.D on 10/30/2017 at 11:26 AM    Note: This dictation was prepared with Dragon dictation along with smaller phrase technology. Any transcriptional errors that result from this process are unintentional.

## 2017-12-02 ENCOUNTER — Encounter: Payer: Medicaid Other | Admitting: Neurology

## 2017-12-05 ENCOUNTER — Encounter: Payer: Self-pay | Admitting: Neurology

## 2018-02-01 ENCOUNTER — Other Ambulatory Visit: Payer: Self-pay

## 2018-02-01 ENCOUNTER — Encounter: Payer: Self-pay | Admitting: Emergency Medicine

## 2018-02-01 ENCOUNTER — Inpatient Hospital Stay
Admission: EM | Admit: 2018-02-01 | Discharge: 2018-02-02 | DRG: 638 | Disposition: A | Discharge status: Home / Self Care | Payer: Medicaid Other | Attending: Internal Medicine | Admitting: Internal Medicine

## 2018-02-01 DIAGNOSIS — I10 Essential (primary) hypertension: Secondary | ICD-10-CM | POA: Diagnosis present

## 2018-02-01 DIAGNOSIS — Z888 Allergy status to other drugs, medicaments and biological substances status: Secondary | ICD-10-CM | POA: Diagnosis not present

## 2018-02-01 DIAGNOSIS — E1010 Type 1 diabetes mellitus with ketoacidosis without coma: Principal | ICD-10-CM | POA: Diagnosis present

## 2018-02-01 DIAGNOSIS — Z79899 Other long term (current) drug therapy: Secondary | ICD-10-CM

## 2018-02-01 DIAGNOSIS — Z9842 Cataract extraction status, left eye: Secondary | ICD-10-CM | POA: Diagnosis not present

## 2018-02-01 DIAGNOSIS — N179 Acute kidney failure, unspecified: Secondary | ICD-10-CM | POA: Diagnosis not present

## 2018-02-01 DIAGNOSIS — E861 Hypovolemia: Secondary | ICD-10-CM | POA: Diagnosis present

## 2018-02-01 DIAGNOSIS — Z87891 Personal history of nicotine dependence: Secondary | ICD-10-CM | POA: Diagnosis not present

## 2018-02-01 DIAGNOSIS — Z794 Long term (current) use of insulin: Secondary | ICD-10-CM | POA: Diagnosis not present

## 2018-02-01 DIAGNOSIS — E1110 Type 2 diabetes mellitus with ketoacidosis without coma: Secondary | ICD-10-CM | POA: Diagnosis present

## 2018-02-01 DIAGNOSIS — R112 Nausea with vomiting, unspecified: Secondary | ICD-10-CM | POA: Diagnosis present

## 2018-02-01 DIAGNOSIS — F909 Attention-deficit hyperactivity disorder, unspecified type: Secondary | ICD-10-CM | POA: Diagnosis present

## 2018-02-01 DIAGNOSIS — F419 Anxiety disorder, unspecified: Secondary | ICD-10-CM | POA: Diagnosis present

## 2018-02-01 DIAGNOSIS — E1011 Type 1 diabetes mellitus with ketoacidosis with coma: Secondary | ICD-10-CM | POA: Diagnosis not present

## 2018-02-01 DIAGNOSIS — E871 Hypo-osmolality and hyponatremia: Secondary | ICD-10-CM | POA: Diagnosis not present

## 2018-02-01 HISTORY — DX: Anxiety disorder, unspecified: F41.9

## 2018-02-01 LAB — COMPREHENSIVE METABOLIC PANEL
ALT: 14 U/L — ABNORMAL LOW (ref 17–63)
ANION GAP: 18 — AB (ref 5–15)
AST: 15 U/L (ref 15–41)
Albumin: 3.9 g/dL (ref 3.5–5.0)
Alkaline Phosphatase: 128 U/L — ABNORMAL HIGH (ref 38–126)
BUN: 27 mg/dL — ABNORMAL HIGH (ref 6–20)
CALCIUM: 9.5 mg/dL (ref 8.9–10.3)
CO2: 18 mmol/L — AB (ref 22–32)
Chloride: 91 mmol/L — ABNORMAL LOW (ref 101–111)
Creatinine, Ser: 1.51 mg/dL — ABNORMAL HIGH (ref 0.61–1.24)
GFR calc non Af Amer: 58 mL/min — ABNORMAL LOW (ref >60)
Glucose, Bld: 423 mg/dL — ABNORMAL HIGH (ref 65–99)
POTASSIUM: 4.7 mmol/L (ref 3.5–5.1)
SODIUM: 127 mmol/L — AB (ref 135–145)
Total Bilirubin: 1.5 mg/dL — ABNORMAL HIGH (ref 0.3–1.2)
Total Protein: 8 g/dL (ref 6.5–8.1)

## 2018-02-01 LAB — CBC
HCT: 41.3 % (ref 40.0–52.0)
HEMOGLOBIN: 13.6 g/dL (ref 13.0–18.0)
MCH: 30.2 pg (ref 26.0–34.0)
MCHC: 32.8 g/dL (ref 32.0–36.0)
MCV: 92.1 fL (ref 80.0–100.0)
Platelets: 408 10*3/uL (ref 150–440)
RBC: 4.48 MIL/uL (ref 4.40–5.90)
RDW: 14.5 % (ref 11.5–14.5)
WBC: 8.5 10*3/uL (ref 3.8–10.6)

## 2018-02-01 LAB — GLUCOSE, CAPILLARY
GLUCOSE-CAPILLARY: 347 mg/dL — AB (ref 65–99)
GLUCOSE-CAPILLARY: 363 mg/dL — AB (ref 65–99)

## 2018-02-01 LAB — URINALYSIS, COMPLETE (UACMP) WITH MICROSCOPIC
BILIRUBIN URINE: NEGATIVE
Bacteria, UA: NONE SEEN
Glucose, UA: >=500 mg/dL — AB
Hgb urine dipstick: NEGATIVE
KETONES UR: 80 mg/dL — AB
Leukocytes, UA: NEGATIVE
NITRITE: NEGATIVE
Protein, ur: NEGATIVE mg/dL
SPECIFIC GRAVITY, URINE: 1.012 (ref 1.005–1.030)
Squamous Epithelial / LPF: NONE SEEN (ref 0–5)
pH: 5 (ref 5.0–8.0)

## 2018-02-01 LAB — LIPASE, BLOOD: LIPASE: 18 U/L (ref 11–51)

## 2018-02-01 MED ORDER — SODIUM CHLORIDE 0.9 % IV BOLUS
1000.0000 mL | Freq: Once | INTRAVENOUS | Status: AC
  Administered 2018-02-01: 1000 mL via INTRAVENOUS

## 2018-02-01 MED ORDER — DEXTROSE-NACL 5-0.45 % IV SOLN
INTRAVENOUS | Status: DC
  Administered 2018-02-01 – 2018-02-02 (×2): via INTRAVENOUS

## 2018-02-01 MED ORDER — ONDANSETRON HCL 4 MG/2ML IJ SOLN
4.0000 mg | Freq: Once | INTRAMUSCULAR | Status: AC
  Administered 2018-02-01: 4 mg via INTRAVENOUS
  Filled 2018-02-01: qty 2

## 2018-02-01 MED ORDER — SODIUM CHLORIDE 0.9 % IV SOLN
INTRAVENOUS | Status: DC
  Administered 2018-02-01: 2.9 [IU]/h via INTRAVENOUS
  Filled 2018-02-01: qty 1

## 2018-02-01 NOTE — ED Notes (Signed)
Pt is a prisoner brought in by [place] sheriff dept.  Pt reports n/v/d for past 2 days.  Pt states blood sugar is high.  Iv started.  Pt unable to void at this time.

## 2018-02-01 NOTE — ED Notes (Signed)
fsbs 363

## 2018-02-01 NOTE — ED Notes (Signed)
Report off to dajea rn  

## 2018-02-01 NOTE — ED Triage Notes (Signed)
Pt to triage via w/c with no distress, brought from jail ; c/o generalized pain accomp by N/V/D x 4 days; st FSBS was 430

## 2018-02-01 NOTE — ED Provider Notes (Signed)
[facility] Emergency Department Provider Note ____________________________________________   First MD Initiated Contact with Patient 02/01/18 2222     (approximate)  I have reviewed the triage vital signs and the nursing notes.   HISTORY  Chief Complaint Emesis; Generalized Body Aches; and Diarrhea    HPI Jackson Miller is a 37 y.o. male with a history of diabetes, previous DKA.  Type I diabetic for 20 years according to the patient  Patient reports that he was incarcerated about 2 days ago, since then his insulin has been changed from his normal home regimen to 70/30 insulin.  Yesterday began experiencing feeling of nausea, fatigue, increased urination and thirst.  Reports he started to think he may be going into DKA.  They continue to give him insulin but he does not know the exact dose.  This evening he got worse, became nauseated vomiting feeling very light headed and weak.  Believes he is back in "DKA".  No chest pain.  No shortness of breath.  No fevers or chills.  Feeling nauseated occasional vomiting and some slight loose stool last day as well.   Past Medical History:  Diagnosis Date  . Diabetes mellitus without complication [facility])     Patient Active Problem List   Diagnosis Date Noted  . DKA (diabetic ketoacidoses) (HCC) 10/28/2017    Past Surgical History:  Procedure Laterality Date  . CATARACT EXTRACTION Left   . CATARACT EXTRACTION Left     Prior to Admission medications   Medication Sig Start Date End Date Taking? Authorizing Provider  amphetamine-dextroamphetamine (ADDERALL) 20 MG tablet Take 20 mg by mouth daily. 10/21/17  Yes [provider]  buPROPion (WELLBUTRIN XL) 150 MG 24 hr tablet Take 1 tablet by mouth every morning. 01/18/18  Yes [provider]  citalopram (CELEXA) 20 MG tablet Take 20 mg by mouth daily.  10/13/17  Yes [provider]  Continuous Blood Gluc Sensor (FREESTYLE LIBRE 14 DAY  SENSOR) MISC USE ONE EACH EVERY 14 DAYS TO CHECK BLOOD GLUCOSE **NOT COVERED BY MEDICAID** 01/14/18  Yes [provider]  GLUCAGON EMERGENCY 1 MG injection PLEASE SEE ATTACHED FOR DETAILED DIRECTIONS 12/29/17  Yes [provider]  insulin aspart (NOVOLOG) 100 UNIT/ML injection Inject 0-5 Units into the skin at bedtime. Patient taking differently: Inject 5 Units into the skin 3 (three) times daily with meals.  10/30/17  Yes Katha Hamming, MD  lisinopril (PRINIVIL,ZESTRIL) 5 MG tablet TAKE 1 TABLET BY MOUTH EVERY DAY 01/13/17  Yes [provider]  TOUJEO SOLOSTAR 300 UNIT/ML SOPN INJECT 0.1 ML (30 UNITS TOTAL) UNDER THE SKIN DAILY AT 0600. 07/29/17  Yes [provider]    Allergies Reglan [metoclopramide]  No family history on file.  Social History Social History   Tobacco Use  . Smoking status: Former Games developer  . Smokeless tobacco: Never Used  Substance Use Topics  . Alcohol use: No  . Drug use: Yes    Frequency: 0.2 times per week    Types: Marijuana    Review of Systems Constitutional: No fever/chills but reports fatigue Eyes: No visual changes. ENT: No sore throat.  Feels very dry. Cardiovascular: Denies chest pain. Respiratory: Denies shortness of breath. Gastrointestinal: No abdominal pain.  No constipation. Genitourinary: Negative for dysuria.  Increased urination the last 2 days. Musculoskeletal: Negative for back pain. Skin: Negative for rash. Neurological: Negative for headaches, focal weakness or numbness.    ____________________________________________   PHYSICAL EXAM:  VITAL SIGNS: ED Triage Vitals [02/01/18  2106]  Enc Vitals Group     BP (!) 159/87     Pulse Rate (!) 104     Resp 18     Temp 98.6 F (37 C)     Temp Source Oral     SpO2 99 %     Weight 180 lb (81.6 kg)     Height 5\' 8"  (1.727 m)     Head Circumference      Peak Flow      Pain Score 9     Pain Loc      Pain Edu?      Excl. in GC?      Constitutional: Alert and oriented.  Mildly ill-appearing.  In no acute distress, very pleasant but does appear generally ill Eyes: Conjunctivae are normal. Head: Atraumatic. Nose: No congestion/rhinnorhea. Mouth/Throat: Mucous membranes are very dry. Neck: No stridor.   Cardiovascular: Normal rate, regular rhythm. Grossly normal heart sounds.  Good peripheral circulation. Respiratory: Normal respiratory effort.  No retractions. Lungs CTAB. Gastrointestinal: Soft and nontender. No distention. Musculoskeletal: No lower extremity tenderness nor edema. Neurologic:  Normal speech and language. No gross focal neurologic deficits are appreciated.  Skin:  Skin is warm, dry and intact. No rash noted. Psychiatric: Mood and affect are normal. Speech and behavior are normal.  ____________________________________________   LABS (all labs ordered are listed, but only abnormal results are displayed)  Labs Reviewed  COMPREHENSIVE METABOLIC PANEL - Abnormal; Notable for the following components:      Result Value   Sodium 127 (*)    Chloride 91 (*)    CO2 18 (*)    Glucose, Bld 423 (*)    BUN 27 (*)    Creatinine, Ser 1.51 (*)    ALT 14 (*)    Alkaline Phosphatase 128 (*)    Total Bilirubin 1.5 (*)    GFR calc non Af Amer 58 (*)    Anion gap 18 (*)    All other components within normal limits  URINALYSIS, COMPLETE (UACMP) WITH MICROSCOPIC - Abnormal; Notable for the following components:   Color, Urine STRAW (*)    APPearance CLEAR (*)    Glucose, UA >=500 (*)    Ketones, ur 80 (*)    All other components within normal limits  GLUCOSE, CAPILLARY - Abnormal; Notable for the following components:   Glucose-Capillary 363 (*)    All other components within normal limits  GLUCOSE, CAPILLARY - Abnormal; Notable for the following components:   Glucose-Capillary 347 (*)    All other components within normal limits  LIPASE, BLOOD  CBC  BASIC METABOLIC PANEL  CBG MONITORING, ED  CBG  MONITORING, ED  CBG MONITORING, ED  CBG MONITORING, ED  CBG MONITORING, ED  CBG MONITORING, ED  CBG MONITORING, ED  CBG MONITORING, ED  CBG MONITORING, ED  CBG MONITORING, ED  CBG MONITORING, ED  CBG MONITORING, ED  CBG MONITORING, ED  CBG MONITORING, ED  CBG MONITORING, ED  CBG MONITORING, ED  CBG MONITORING, ED  CBG MONITORING, ED  CBG MONITORING, ED  CBG MONITORING, ED  CBG MONITORING, ED  CBG MONITORING, ED  CBG MONITORING, ED  CBG MONITORING, ED  CBG MONITORING, ED  CBG MONITORING, ED  CBG MONITORING, ED  CBG MONITORING, ED   ____________________________________________  EKG   ____________________________________________  RADIOLOGY   ____________________________________________   PROCEDURES  Procedure(s) performed: None  Procedures  Critical Care performed: Yes, see critical care note(s)  CRITICAL CARE Performed by: Sharyn Creamer  Total critical care time: 35 minutes  Critical care time was exclusive of separately billable procedures and treating other patients.  Critical care was necessary to treat or prevent imminent or life-threatening deterioration.  Critical care was time spent personally by me on the following activities: development of treatment plan with patient and/or surrogate as well as nursing, discussions with consultants, evaluation of patient's response to treatment, examination of patient, obtaining history from patient or surrogate, ordering and performing treatments and interventions, ordering and review of laboratory studies, ordering and review of radiographic studies, pulse oximetry and re-evaluation of patient's condition.  ____________________________________________   INITIAL IMPRESSION / ASSESSMENT AND PLAN / ED COURSE  Pertinent labs & imaging results that were available during my care of the patient were reviewed by me and considered in my medical decision making (see chart for details).  Patient presents for fatigue,  polyuria, dry mouth and a recent change in his insulin.  He reports he started to feel like he is going into DKA, review of his labs clinical history and exam seem to be most consistent with DKA and I suspect likely precipitated by potential stress of recent incarceration and change in his medication regimen is is not able to get his normal medications in jail at present.  ----------------------------------------- 11:08 PM on 02/01/2018 -----------------------------------------  Patient reports improvement in symptoms.  Appears to be improving, resting more comfortably.  Normal alertness.  Very reassuring examination except for what appears to be dehydration and concern for DKA.  Elevated anion gap with low CO2.  His blood sugar is improving with treatment and hydration, will start patient on insulin infusion and glucose stabilizer therapy.  Discussed with Dr. Caryn BeeMaier, hospitalist will see and admit.   Ongoing care including follow-up on DKA management and repeat BMP assigned to Dr. Dolores FrameSung at 1230       ____________________________________________   FINAL CLINICAL IMPRESSION(S) / ED DIAGNOSES  Final diagnoses:  Type 1 diabetes mellitus with ketoacidosis without coma (HCC)      NEW MEDICATIONS STARTED DURING THIS VISIT:  New Prescriptions   No medications on file     Note:  This document was prepared using Dragon voice recognition software and may include unintentional dictation errors.     Sharyn CreamerQuale, Mark, MD 02/02/18 [phone]

## 2018-02-01 NOTE — H&P (Signed)
[facility] [place] [facility]   PATIENT NAME: Raymie Giammarco    MR#:  [id number]  DATE OF BIRTH:  [birth year]  DATE OF ADMISSION:  02/01/2018  PRIMARY CARE PHYSICIAN: Patient, No Pcp Per   REQUESTING/REFERRING PHYSICIAN:   CHIEF COMPLAINT:   Chief Complaint  Patient presents with  . Emesis  . Generalized Body Aches  . Diarrhea    HISTORY OF PRESENT ILLNESS: Homar Weinkauf  is a 37 y.o. male with a known history of anxiety disorder, ADHD and diabetes type 1 since age 46. Patient was brought from jail with symptoms of nausea, vomiting, fatigue and increased urination and thirst going on for the past 2 days, gradually getting worse.  Patient was incarcerated 3 days ago and his insulin regimen was changed while in jail.  He was switched to 70/30 insulin.  Patient also complained that the food in jail is not exactly low-carb.  He states that his symptoms are consistent with prior DKA episodes.  He denies any fever or chills, no cough, no chest pain, no shortness of breath. Blood test in the emergency room show sodium level of 127, creatinine level of 1.51, anion gap 18, blood sugar at 423. Patient was started on insulin drip and admitted to intensive care unit.   PAST MEDICAL HISTORY:   Past Medical History:  Diagnosis Date  . Anxiety    ADHD  . Diabetes mellitus without complication (HCC)     PAST SURGICAL HISTORY:  Past Surgical History:  Procedure Laterality Date  . CATARACT EXTRACTION Left   . CATARACT EXTRACTION Left   . EYE SURGERY     Cataract Surgery in Left eye    SOCIAL HISTORY:  Social History   Tobacco Use  . Smoking status: Former Smoker    Types: Cigarettes  . Smokeless tobacco: Never Used  Substance Use Topics  . Alcohol use: No    FAMILY HISTORY: History reviewed. No pertinent family history.  DRUG ALLERGIES:  Allergies  Allergen Reactions  . Reglan [Metoclopramide] Other (See Comments)    shakes    REVIEW OF  SYSTEMS:   CONSTITUTIONAL: No fever, but patient complains of fatigue and generalized weakness.  EYES: No blurred or double vision.  EARS, NOSE, AND THROAT: No tinnitus or ear pain.  RESPIRATORY: No cough, shortness of breath, wheezing or hemoptysis.  CARDIOVASCULAR: No chest pain, orthopnea, edema.  GASTROINTESTINAL: Positive for nausea, vomiting; no diarrhea or abdominal pain.  GENITOURINARY: No dysuria, hematuria, but patient complains of increased urination.  ENDOCRINE: Positive for polyuria and increased thirst,  HEMATOLOGY: No anemia, easy bruising or bleeding SKIN: No rash or lesion. MUSCULOSKELETAL: No joint pain or arthritis.   NEUROLOGIC: No focal weakness.  PSYCHIATRY: No anxiety or depression.   MEDICATIONS AT HOME:  Prior to Admission medications   Medication Sig Start Date End Date Taking? Authorizing Provider  amphetamine-dextroamphetamine (ADDERALL) 20 MG tablet Take 20 mg by mouth daily. 10/21/17  Yes [provider]  buPROPion (WELLBUTRIN XL) 150 MG 24 hr tablet Take 1 tablet by mouth every morning. 01/18/18  Yes [provider]  citalopram (CELEXA) 20 MG tablet Take 20 mg by mouth daily.  10/13/17  Yes [provider]  Continuous Blood Gluc Sensor (FREESTYLE LIBRE 14 DAY SENSOR) MISC USE ONE EACH EVERY 14 DAYS TO CHECK BLOOD GLUCOSE **NOT COVERED BY MEDICAID** 01/14/18  Yes [provider]  GLUCAGON EMERGENCY 1 MG injection PLEASE SEE ATTACHED FOR DETAILED DIRECTIONS 12/29/17  Yes [provider]  insulin aspart (NOVOLOG) 100 UNIT/ML injection Inject 0-5 Units into the skin at bedtime. Patient taking differently: Inject 5 Units into the skin 3 (three) times daily with meals.  10/30/17  Yes Katha Hamming, MD  lisinopril (PRINIVIL,ZESTRIL) 5 MG tablet TAKE 1 TABLET BY MOUTH EVERY DAY 01/13/17  Yes [provider]  TOUJEO SOLOSTAR 300 UNIT/ML SOPN INJECT 0.1 ML (30 UNITS TOTAL) UNDER THE SKIN DAILY AT 0600. 07/29/17  Yes  [provider]      PHYSICAL EXAMINATION:   VITAL SIGNS: Blood pressure 118/68, pulse 90, temperature 98.3 F (36.8 C), temperature source Axillary, resp. rate 16, height 5\' 8"  (1.727 m), weight 81.7 kg (180 lb 1.9 oz), SpO2 97 %.  GENERAL:  37 y.o.-year-old patient lying in the bed with moderate distress, secondary to nausea and fatigue.  EYES: Pupils equal, round, reactive to light and accommodation. No scleral icterus. Extraocular muscles intact.  HEENT: Head atraumatic, normocephalic. Oropharynx and nasopharynx clear.  NECK:  Supple, no jugular venous distention. No thyroid enlargement, no tenderness.  LUNGS: Normal breath sounds bilaterally, no wheezing, rales,rhonchi or crepitation. No use of accessory muscles of respiration.  CARDIOVASCULAR: S1, S2 normal. No murmurs, rubs, or gallops.  ABDOMEN: Soft, nontender, nondistended. Bowel sounds present. No organomegaly or mass.  EXTREMITIES: No pedal edema, cyanosis, or clubbing.  NEUROLOGIC: Cranial nerves II through XII are intact. Muscle strength 5/5 in all extremities. Sensation intact. PSYCHIATRIC: The patient is alert and oriented x 3.  SKIN: No obvious rash, lesion, or ulcer.   LABORATORY PANEL:   CBC Recent Labs  Lab 02/01/18 2108  WBC 8.5  HGB 13.6  HCT 41.3  PLT 408  MCV 92.1  MCH 30.2  MCHC 32.8  RDW 14.5   ------------------------------------------------------------------------------------------------------------------  Chemistries  Recent Labs  Lab 02/01/18 2108 02/02/18 0227  NA 127* 135  K 4.7 4.0  CL 91* 102  CO2 18* 22  GLUCOSE 423* 197*  BUN 27* 21*  CREATININE 1.51* 1.17  CALCIUM 9.5 8.8*  AST 15  --   ALT 14*  --   ALKPHOS 128*  --   BILITOT 1.5*  --    ------------------------------------------------------------------------------------------------------------------ estimated creatinine clearance is 85.3 mL/min (by C-G formula based on SCr of 1.17  mg/dL). ------------------------------------------------------------------------------------------------------------------ No results for input(s): TSH, T4TOTAL, T3FREE, THYROIDAB in the last 72 hours.  Invalid input(s): FREET3   Coagulation profile No results for input(s): INR, PROTIME in the last 168 hours. ------------------------------------------------------------------------------------------------------------------- No results for input(s): DDIMER in the last 72 hours. -------------------------------------------------------------------------------------------------------------------  Cardiac Enzymes No results for input(s): CKMB, TROPONINI, MYOGLOBIN in the last 168 hours.  Invalid input(s): CK ------------------------------------------------------------------------------------------------------------------ Invalid input(s): POCBNP  ---------------------------------------------------------------------------------------------------------------  Urinalysis    Component Value Date/Time   COLORURINE STRAW (A) 02/01/2018 2246   APPEARANCEUR CLEAR (A) 02/01/2018 2246   LABSPEC 1.012 02/01/2018 2246   PHURINE 5.0 02/01/2018 2246   GLUCOSEU >=500 (A) 02/01/2018 2246   HGBUR NEGATIVE 02/01/2018 2246   BILIRUBINUR NEGATIVE 02/01/2018 2246   KETONESUR 80 (A) 02/01/2018 2246   PROTEINUR NEGATIVE 02/01/2018 2246   NITRITE NEGATIVE 02/01/2018 2246   LEUKOCYTESUR NEGATIVE 02/01/2018 2246     RADIOLOGY: No results found.  EKG: Orders placed or performed during the hospital encounter of 10/28/17  . EKG 12-Lead  . EKG 12-Lead    IMPRESSION AND PLAN:  1.  DKA, due to lack of insulin.  Patient admitted to intensive care unit.  We will start insulin protocol with high volume IV fluids and insulin IV.  Continue to monitor BMP every 4 hours. 2.  Hypertension, stable restart home medications. 3.  ADHD, continue home medications.  All the records are reviewed and case discussed  with ED provider. Management plans discussed with the patient, who is in agreement.  CODE STATUS: FULL    Code Status Orders  (From admission, onward)        Start     Ordered   02/02/18 0036  Full code  Continuous     02/02/18 0035    Code Status History    Date Active Date Inactive Code Status Order ID Comments User Context   10/28/2017 0644 10/30/2017 1427 Full Code [id number]  Salary, Evelena AsaMontell D, MD ED       TOTAL TIME TAKING CARE OF THIS PATIENT: 45 minutes.    Cammy CopaAngela Mell Guia M.D on 02/02/2018 at 4:27 AM  Between 7am to 6pm - Pager - [phone]  After 6pm go to [email or web address] - password [email or web address]  [facility]  Office  [phone]  CC: Primary care physician; Patient, No Pcp Per

## 2018-02-02 ENCOUNTER — Encounter: Payer: Self-pay | Admitting: Surgery

## 2018-02-02 ENCOUNTER — Other Ambulatory Visit: Payer: Self-pay

## 2018-02-02 DIAGNOSIS — E1011 Type 1 diabetes mellitus with ketoacidosis with coma: Secondary | ICD-10-CM

## 2018-02-02 LAB — CBC
HCT: 35.3 % — ABNORMAL LOW (ref 40.0–52.0)
Hemoglobin: 12.1 g/dL — ABNORMAL LOW (ref 13.0–18.0)
MCH: 30.8 pg (ref 26.0–34.0)
MCHC: 34.2 g/dL (ref 32.0–36.0)
MCV: 90.1 fL (ref 80.0–100.0)
PLATELETS: 367 10*3/uL (ref 150–440)
RBC: 3.92 MIL/uL — AB (ref 4.40–5.90)
RDW: 14.3 % (ref 11.5–14.5)
WBC: 7.2 10*3/uL (ref 3.8–10.6)

## 2018-02-02 LAB — GLUCOSE, CAPILLARY
GLUCOSE-CAPILLARY: 106 mg/dL — AB (ref 65–99)
GLUCOSE-CAPILLARY: 149 mg/dL — AB (ref 65–99)
GLUCOSE-CAPILLARY: 171 mg/dL — AB (ref 65–99)
GLUCOSE-CAPILLARY: 201 mg/dL — AB (ref 65–99)
GLUCOSE-CAPILLARY: 207 mg/dL — AB (ref 65–99)
GLUCOSE-CAPILLARY: 331 mg/dL — AB (ref 65–99)
Glucose-Capillary: 416 mg/dL — ABNORMAL HIGH (ref 65–99)
Glucose-Capillary: 158 mg/dL — ABNORMAL HIGH (ref 65–99)
Glucose-Capillary: 202 mg/dL — ABNORMAL HIGH (ref 65–99)
Glucose-Capillary: 146 mg/dL — ABNORMAL HIGH (ref 65–99)
Glucose-Capillary: 183 mg/dL — ABNORMAL HIGH (ref 65–99)

## 2018-02-02 LAB — BASIC METABOLIC PANEL
Anion gap: 11 (ref 5–15)
BUN: 21 mg/dL — AB (ref 6–20)
CO2: 22 mmol/L (ref 22–32)
Calcium: 8.8 mg/dL — ABNORMAL LOW (ref 8.9–10.3)
Chloride: 102 mmol/L (ref 101–111)
Creatinine, Ser: 1.17 mg/dL (ref 0.61–1.24)
GFR calc Af Amer: >60 mL/min (ref >60)
Glucose, Bld: 197 mg/dL — ABNORMAL HIGH (ref 65–99)
POTASSIUM: 4 mmol/L (ref 3.5–5.1)
SODIUM: 135 mmol/L (ref 135–145)

## 2018-02-02 LAB — MRSA PCR SCREENING: MRSA BY PCR: NEGATIVE

## 2018-02-02 MED ORDER — SODIUM CHLORIDE 0.9 % IV SOLN: INTRAVENOUS | Status: DC

## 2018-02-02 MED ORDER — BUPROPION HCL ER (XL) 150 MG PO TB24
150.0000 mg | ORAL_TABLET | ORAL | Status: DC
  Administered 2018-02-02: 150 mg via ORAL
  Filled 2018-02-02: qty 1

## 2018-02-02 MED ORDER — INSULIN ASPART 100 UNIT/ML ~~LOC~~ SOLN
0.0000 [IU] | Freq: Three times a day (TID) | SUBCUTANEOUS | Status: DC
  Administered 2018-02-02: 2 [IU] via SUBCUTANEOUS
  Filled 2018-02-02: qty 1

## 2018-02-02 MED ORDER — SODIUM CHLORIDE 0.9 % IV SOLN
INTRAVENOUS | Status: DC
  Administered 2018-02-02: 5.4 [IU]/h via INTRAVENOUS
  Filled 2018-02-02: qty 1

## 2018-02-02 MED ORDER — PROCHLORPERAZINE MALEATE 10 MG PO TABS
10.0000 mg | ORAL_TABLET | Freq: Three times a day (TID) | ORAL | Status: DC | PRN
  Administered 2018-02-02: 10 mg via ORAL
  Filled 2018-02-02 (×3): qty 1

## 2018-02-02 MED ORDER — CITALOPRAM HYDROBROMIDE 20 MG PO TABS
20.0000 mg | ORAL_TABLET | Freq: Every day | ORAL | Status: DC
  Administered 2018-02-02: 20 mg via ORAL
  Filled 2018-02-02: qty 1

## 2018-02-02 MED ORDER — AMPHETAMINE-DEXTROAMPHETAMINE 20 MG PO TABS: 20.0000 mg | ORAL_TABLET | Freq: Every day | ORAL | 0 refills | Status: AC

## 2018-02-02 MED ORDER — INSULIN GLARGINE 100 UNIT/ML ~~LOC~~ SOLN
4.0000 [IU] | Freq: Once | SUBCUTANEOUS | Status: AC
  Administered 2018-02-02: 4 [IU] via SUBCUTANEOUS
  Filled 2018-02-02: qty 0.04

## 2018-02-02 MED ORDER — SODIUM CHLORIDE 0.9 % IV SOLN
INTRAVENOUS | Status: DC
  Administered 2018-02-02: 01:00:00 via INTRAVENOUS

## 2018-02-02 MED ORDER — LISINOPRIL 5 MG PO TABS
5.0000 mg | ORAL_TABLET | Freq: Every day | ORAL | Status: DC
  Administered 2018-02-02: 5 mg via ORAL
  Filled 2018-02-02: qty 1

## 2018-02-02 MED ORDER — INSULIN ASPART 100 UNIT/ML ~~LOC~~ SOLN
0.0000 [IU] | Freq: Three times a day (TID) | SUBCUTANEOUS | Status: DC
  Administered 2018-02-02: 5 [IU] via SUBCUTANEOUS
  Filled 2018-02-02: qty 1

## 2018-02-02 MED ORDER — INSULIN ASPART 100 UNIT/ML ~~LOC~~ SOLN: 0.0000 [IU] | Freq: Every day | SUBCUTANEOUS | Status: DC

## 2018-02-02 MED ORDER — INSULIN ASPART 100 UNIT/ML ~~LOC~~ SOLN
4.0000 [IU] | Freq: Three times a day (TID) | SUBCUTANEOUS | Status: DC
  Administered 2018-02-02: 4 [IU] via SUBCUTANEOUS
  Filled 2018-02-02: qty 1

## 2018-02-02 MED ORDER — OXYCODONE-ACETAMINOPHEN 5-325 MG PO TABS
2.0000 | ORAL_TABLET | ORAL | Status: DC | PRN
  Administered 2018-02-02 (×3): 2 via ORAL
  Filled 2018-02-02 (×4): qty 2

## 2018-02-02 MED ORDER — HEPARIN SODIUM (PORCINE) 5000 UNIT/ML IJ SOLN
5000.0000 [IU] | Freq: Three times a day (TID) | INTRAMUSCULAR | Status: DC
  Administered 2018-02-02: 5000 [IU] via SUBCUTANEOUS
  Filled 2018-02-02: qty 1

## 2018-02-02 MED ORDER — AMPHETAMINE-DEXTROAMPHETAMINE 5 MG PO TABS
20.0000 mg | ORAL_TABLET | Freq: Every day | ORAL | Status: DC
  Administered 2018-02-02: 20 mg via ORAL
  Filled 2018-02-02: qty 4

## 2018-02-02 MED ORDER — INSULIN GLARGINE 100 UNIT/ML ~~LOC~~ SOLN
20.0000 [IU] | Freq: Every day | SUBCUTANEOUS | Status: DC
  Administered 2018-02-02: 20 [IU] via SUBCUTANEOUS
  Filled 2018-02-02: qty 0.2

## 2018-02-02 MED ORDER — INSULIN GLARGINE 100 UNIT/ML ~~LOC~~ SOLN
24.0000 [IU] | Freq: Every day | SUBCUTANEOUS | Status: DC
  Filled 2018-02-02: qty 0.24

## 2018-02-02 MED ORDER — DEXTROSE-NACL 5-0.45 % IV SOLN: INTRAVENOUS | Status: DC

## 2018-02-02 MED ORDER — POTASSIUM CHLORIDE 10 MEQ/100ML IV SOLN
10.0000 meq | INTRAVENOUS | Status: DC
  Administered 2018-02-02: 10 meq via INTRAVENOUS
  Filled 2018-02-02 (×2): qty 100

## 2018-02-02 MED ORDER — BUPROPION HCL ER (XL) 150 MG PO TB24: 150.0000 mg | ORAL_TABLET | ORAL | 0 refills | Status: AC

## 2018-02-02 MED ORDER — PNEUMOCOCCAL VAC POLYVALENT 25 MCG/0.5ML IJ INJ: 0.5000 mL | INJECTION | INTRAMUSCULAR | Status: DC

## 2018-02-02 MED ORDER — POTASSIUM CHLORIDE 10 MEQ/100ML IV SOLN
10.0000 meq | INTRAVENOUS | Status: AC
  Filled 2018-02-02 (×2): qty 100

## 2018-02-02 MED ORDER — ALPRAZOLAM 0.25 MG PO TABS: 0.2500 mg | ORAL_TABLET | Freq: Every evening | ORAL | Status: DC | PRN

## 2018-02-02 MED ORDER — ENOXAPARIN SODIUM 40 MG/0.4ML ~~LOC~~ SOLN: 40.0000 mg | SUBCUTANEOUS | Status: DC

## 2018-02-02 MED ORDER — HYDROMORPHONE HCL 1 MG/ML IJ SOLN: 0.5000 mg | INTRAMUSCULAR | Status: DC | PRN

## 2018-02-02 NOTE — Discharge Summary (Signed)
[facility] [place] at [facility]   PATIENT NAME: Jackson Miller    MR#:  [id number]  DATE OF BIRTH:  [birth year]  DATE OF ADMISSION:  02/01/2018 ADMITTING PHYSICIAN: Cammy Copa, MD  DATE OF DISCHARGE: 02/02/2018  PRIMARY CARE PHYSICIAN: Tora Kindred, DO    ADMISSION DIAGNOSIS:  Type 1 diabetes mellitus with ketoacidosis without coma (HCC) [E10.10]  DISCHARGE DIAGNOSIS:  DKA with type I diabetes ADHD SECONDARY DIAGNOSIS:   Past Medical History:  Diagnosis Date  . Anxiety    ADHD  . Diabetes mellitus without complication [facility])     HOSPITAL COURSE:   Jackson Miller  is a 37 y.o. male with a known history of anxiety disorder, ADHD and diabetes type 1 since age 78.Patient was brought from jail with symptoms of nausea, vomiting, fatigue and increased urination and thirst going on for the past 2 days, gradually getting worse.  Patient was incarcerated 3 days ago and his insulin regimen was changed while in jail.  He was switched to 70/30 insulin  1.  DKA, due to lack of insulin.  Patient was admitted to intensive care unit.   -received IV insulin protocol with high volume IV fluids and insulin IV. -   anion gap closed. Patient tolerating oral diet. No vomiting. Appears you will limit. Sugar stable. Would switch him to his home insulin regimen. Patient will follow-up with his endocrinologist at [facility].  2.  Hypertension, stable restart home medications.  3.  ADHD, continue home medications.  Will discharged to home. Per ICU  RN's note Patient is released on nonsecure bond.  CONSULTS OBTAINED:    DRUG ALLERGIES:   Allergies  Allergen Reactions  . Reglan [Metoclopramide] Other (See Comments)    shakes    DISCHARGE MEDICATIONS:   Allergies as of 02/02/2018      Reactions   Reglan [metoclopramide] Other (See Comments)   shakes      Medication List    TAKE these medications   amphetamine-dextroamphetamine 20 MG tablet Commonly  known as:  ADDERALL Take 1 tablet (20 mg total) by mouth daily.   buPROPion 150 MG 24 hr tablet Commonly known as:  WELLBUTRIN XL Take 1 tablet (150 mg total) by mouth every morning.   citalopram 20 MG tablet Commonly known as:  CELEXA Take 20 mg by mouth daily.   FREESTYLE LIBRE 14 DAY SENSOR Misc USE ONE EACH EVERY 14 DAYS TO CHECK BLOOD GLUCOSE **NOT COVERED BY MEDICAID**   GLUCAGON EMERGENCY 1 MG injection Generic drug:  glucagon PLEASE SEE ATTACHED FOR DETAILED DIRECTIONS   insulin aspart 100 UNIT/ML injection Commonly known as:  novoLOG Inject 0-5 Units into the skin at bedtime. What changed:    how much to take  when to take this   lisinopril 5 MG tablet Commonly known as:  PRINIVIL,ZESTRIL TAKE 1 TABLET BY MOUTH EVERY DAY   TOUJEO SOLOSTAR 300 UNIT/ML Sopn Generic drug:  Insulin Glargine INJECT 0.1 ML (30 UNITS TOTAL) UNDER THE SKIN DAILY AT 0600.       If you experience worsening of your admission symptoms, develop shortness of breath, life threatening emergency, suicidal or homicidal thoughts you must seek medical attention immediately by calling 911 or calling your MD immediately  if symptoms less severe.  You Must read complete instructions/literature along with all the possible adverse reactions/side effects for all the Medicines you take and that have been prescribed to you. Take any new Medicines after you have completely understood and accept  all the possible adverse reactions/side effects.   Please note  You were cared for by a hospitalist during your hospital stay. If you have any questions about your discharge medications or the care you received while you were in the hospital after you are discharged, you can call the unit and asked to speak with the hospitalist on call if the hospitalist that took care of you is not available. Once you are discharged, your primary care physician will handle any further medical issues. Please note that NO REFILLS for  any discharge medications will be authorized once you are discharged, as it is imperative that you return to your primary care physician (or establish a relationship with a primary care physician if you do not have one) for your aftercare needs so that they can reassess your need for medications and monitor your lab values. Today   SUBJECTIVE   No new complaints Just a bit anxious about his recent incarceration  VITAL SIGNS:  Blood pressure 125/82, pulse 86, temperature 98.1 F (36.7 C), temperature source Oral, resp. rate (!) 22, height 5\' 8"  (1.727 m), weight 81.7 kg (180 lb 1.9 oz), SpO2 98 %.  I/O:    Intake/Output Summary (Last 24 hours) at 02/02/2018 1421 Last data filed at 02/02/2018 1006 Gross per 24 hour  Intake 1907.53 ml  Output 700 ml  Net 1207.53 ml    PHYSICAL EXAMINATION:  GENERAL:  37 y.o.-year-old patient lying in the bed with no acute distress.  EYES: Pupils equal, round, reactive to light and accommodation. No scleral icterus. Extraocular muscles intact.  HEENT: Head atraumatic, normocephalic. Oropharynx and nasopharynx clear.  NECK:  Supple, no jugular venous distention. No thyroid enlargement, no tenderness.  LUNGS: Normal breath sounds bilaterally, no wheezing, rales,rhonchi or crepitation. No use of accessory muscles of respiration.  CARDIOVASCULAR: S1, S2 normal. No murmurs, rubs, or gallops.  ABDOMEN: Soft, non-tender, non-distended. Bowel sounds present. No organomegaly or mass.  EXTREMITIES: No pedal edema, cyanosis, or clubbing.  NEUROLOGIC: Cranial nerves II through XII are intact. Muscle strength 5/5 in all extremities. Sensation intact. Gait not checked.  PSYCHIATRIC: The patient is alert and oriented x 3.  SKIN: No obvious rash, lesion, or ulcer.   DATA REVIEW:   CBC  Recent Labs  Lab 02/02/18 0645  WBC 7.2  HGB 12.1*  HCT 35.3*  PLT 367    Chemistries  Recent Labs  Lab 02/01/18 2108 02/02/18 0227  NA 127* 135  K 4.7 4.0  CL 91* 102   CO2 18* 22  GLUCOSE 423* 197*  BUN 27* 21*  CREATININE 1.51* 1.17  CALCIUM 9.5 8.8*  AST 15  --   ALT 14*  --   ALKPHOS 128*  --   BILITOT 1.5*  --     Microbiology Results   Recent Results (from the past 240 hour(s))  MRSA PCR Screening     Status: None   Collection Time: 02/02/18  1:17 AM  Result Value Ref Range Status   MRSA by PCR NEGATIVE NEGATIVE Final    Comment:        The GeneXpert MRSA Assay (FDA approved for NASAL specimens only), is one component of a comprehensive MRSA colonization surveillance program. It is not intended to diagnose MRSA infection nor to guide or monitor treatment for MRSA infections. Performed at [facility], [address] [place], [place] [address]     RADIOLOGY:  No results found.   Management plans discussed with the patient, family and they  are in agreement.  CODE STATUS:     Code Status Orders  (From admission, onward)        Start     Ordered   02/02/18 0036  Full code  Continuous     02/02/18 0035    Code Status History    Date Active Date Inactive Code Status Order ID Comments User Context   10/28/2017 0644 10/30/2017 1427 Full Code [id number]  Salary, Evelena Asa, MD ED      TOTAL TIME TAKING CARE OF THIS PATIENT: 40 minutes.    Enedina Finner M.D on 02/02/2018 at 2:21 PM  Between 7am to 6pm - Pager - [phone] After 6pm go to [email or web address] - Social research officer, government  [facility] [place] [facility]  Office  [phone]  CC: Primary care physician; Tora Kindred, DO

## 2018-02-02 NOTE — Progress Notes (Signed)
eLink Physician-Brief Progress Note Patient Name: Elenora FenderStephen Feltes DOB: 10/07/1980 MRN: [id number]   Date of Service  02/02/2018  HPI/Events of Note  Pt transferred from jail due to DKA related to a change in insulin regimen in the jail. No obvious infectious focus.  eICU Interventions  Insulin gtt + other Rx per DKA pathway.        Thomasene Lotkoronkwo U Ogan 02/02/2018, 2:16 AM

## 2018-02-02 NOTE — Progress Notes (Signed)
Patient discharged home per MD order. All discharge instructions given and all questions answered. 

## 2018-02-02 NOTE — Progress Notes (Signed)
Inpatient Diabetes Program Recommendations  AACE/ADA: New Consensus Statement on Inpatient Glycemic Control (2015)  Target Ranges:  Prepandial:   less than 140 mg/dL      Peak postprandial:   less than 180 mg/dL (1-2 hours)      Critically ill patients:  140 - 180 mg/dL   Results for Jackson Miller, Jackson Miller (MRN [id number]) as of 02/02/2018 08:54  Ref. Range 02/01/2018 21:08  Sodium Latest Ref Range: 135 - 145 mmol/L 127 (L)  Potassium Latest Ref Range: 3.5 - 5.1 mmol/L 4.7  Chloride Latest Ref Range: 101 - 111 mmol/L 91 (L)  CO2 Latest Ref Range: 22 - 32 mmol/L 18 (L)  Glucose Latest Ref Range: 65 - 99 mg/dL 409 (H)  BUN Latest Ref Range: 6 - 20 mg/dL 27 (H)  Creatinine Latest Ref Range: 0.61 - 1.24 mg/dL 8.11 (H)  Calcium Latest Ref Range: 8.9 - 10.3 mg/dL 9.5  Anion gap Latest Ref Range: 5 - 15  18 (H)    Admit with: DKA (recent change of home insulin regimen to 70/30 insulin in jail)  History: Type 1 DM (since age 37)  Home DM Meds: Toujeo 30 units daily       Novolog 5 units TID with meals   Current Insulin Orders: Lantus 20 units daily      Novolog Moderate Correction Scale/ SSI (0-15 units) TID AC       Note Patient given 20 units Lantus at 6:30 am today to transition off IV Insulin to SQ insulin.  Novolog to start this AM as well.   MD- Please consider the following in-hospital insulin adjustments:  1. Reduce Novolog SSi to Sensitive scale (0-9 units) TID AC + HS  2. Start Novolog Meal Coverage: Novolog 4 units TID with meals (hold if pt eats <50% of meal) (Use Glycemic Control Order set)  3. Increase Lantus to 24 units daily (please consider giving an extra 4 units Lantus this AM)  4. Patient may only have access to 70/30 insulin in the jail.   If this is the case, then I would recommend patient get 70/30 Insulin 20 units BID with meals in the jail.    This dose would be equivalent to about 28 units NPH insulin divided into 2 doses (14 units with the AM dose and 14  units with the PM dose) and 6 units Regular insulin at meal times (Breakfast and Dinner).  This may more closely resemble what patient is supposed to be taking at home with his Toujeo and Novolog insulins.     Patient saw his Endocrinologist Dr. Crista Miller with [facility] on 12/29/17- At that visit, patient was instructed to do the following: "Patient Instructions [name], MD - 12/29/2017 11:47 AM EDT  1. Start counting carbohydrates with all meals/snacks. Use smart phone apps to help with carb counting (ex: MyFitnessPal, CarbManager). 2. Start using the [employer] 14 day wear continuous glucose sensors. Use coupon to pay out-of-pocket. 3. Lower Toujeo Solostar from 40 to 24 units daily. 4. Change Novolog Flexpen to 1 unit for every 10 grams carbohydrates with meals/snacks and sliding scale 1 unit for glucose 50 above 150 for pre-meal only. 5. Keep diabetes identification and glucose tablets with all times. Check blood sugar before driving. 6. Check thyroid and urine for protein today. Continue Lisinopril for kidney protection. 7. Follow-up in 4 weeks."     --Will follow patient during hospitalization--  Ambrose Finland RN, MSN, CDE Diabetes Coordinator Inpatient Glycemic Control Team  Team Pager: [phone] (8a-5p)

## 2018-02-02 NOTE — Discharge Instructions (Signed)
Pt will check sugars 2-3 times a day and keep log of it patient recommended to follow up with his endocrinologist with [facility] next week

## 2018-02-02 NOTE — Progress Notes (Signed)
Nutrition Brief Note  Patient identified on the Malnutrition Screening Tool (MST) Report  Wt Readings from Last 15 Encounters:  02/02/18 180 lb 1.9 oz (81.7 kg)  10/28/17 180 lb (81.6 kg)  07/26/16 167 lb 1.6 oz (75.8 kg)  11/05/15 145 lb (65.8 kg)   Met with patient in room. Officer also present. Patient reports he has decreased intake for the past 4 days related to limited food choices at jail. He reports there were a lot of breads and starchy options, which he think increased his blood sugar in addition to the change in his insulin regimen. Patient reports he has had type 1 DM since age 60. He is very familiar with carbohydrate counting and dosing his insulin at home. He typically eats 3 meals per day and usually has a good appetite. He reports he has been eating well here and tolerating diet. Denies any N/V or abdominal pain. Patient reports UBW is 180 lbs. NFPE deferred. Patient does not meet criteria for malnutrition at this time. He denies any nutrition concerns or questions.  Body mass index is 27.39 kg/m. Patient meets criteria for overweight based on current BMI.   Current diet order is Carbohydrate modified, patient is consuming approximately 90% of meals at this time. Labs and medications reviewed.   No nutrition interventions warranted at this time. If nutrition issues arise, please consult RD.   Willey Blade, MS, [place], LDN Office: [phone] Pager: [phone] After Hours/Weekend Pager: [phone]

## 2018-02-02 NOTE — Progress Notes (Signed)
Patient released on non secure bond. Patient alert and oriented. He is on room air. Insulin titrated off this am. Tolerating diet and using urinal. Patient being tx to floor.

## 2018-02-02 NOTE — ED Notes (Signed)
Attempted report; was told receiving nurse would call back.

## 2018-02-02 NOTE — Consult Note (Signed)
Name: Jackson FenderStephen Miller MRN: [id number] DOB: 01/07/1981    ADMISSION DATE:  02/01/2018 CONSULTATION DATE: 02/01/2018  REFERRING MD : Dr. Caryn BeeMaier   CHIEF COMPLAINT: Emesis   BRIEF PATIENT DESCRIPTION:  37 yo male admitted with DKA requiring insulin gtt   SIGNIFICANT EVENTS/STUDIES:  06/5 Pt admitted to stepdown unit    HISTORY OF PRESENT ILLNESS:   This is a [age] yo male with a PMH of Uncontrolled Type I Diabetes Mellitus and ADD.  He presented to [facility] ER from jail on 06/5 with nausea, fatigue, and diarrhea onset of symptoms 4 days prior to presentation.  While in jail his insulin has changed from his home regimen to 70/30 insulin.  Per ER notes the pt does not know the exact dose of insulin he is currently receiving. In the ER lab results ruled the pt in for DKA, therefore insulin gtt started.  He was subsequently admitted to the stepdown unit by hospitalist team for further workup and treatment.    PAST MEDICAL HISTORY :   has a past medical history of Diabetes mellitus without complication (HCC).  has a past surgical history that includes Cataract extraction (Left) and Cataract extraction (Left). Prior to Admission medications   Medication Sig Start Date End Date Taking? Authorizing Provider  amphetamine-dextroamphetamine (ADDERALL) 20 MG tablet Take 20 mg by mouth daily. 10/21/17  Yes [provider]  buPROPion (WELLBUTRIN XL) 150 MG 24 hr tablet Take 1 tablet by mouth every morning. 01/18/18  Yes [provider]  citalopram (CELEXA) 20 MG tablet Take 20 mg by mouth daily.  10/13/17  Yes [provider]  Continuous Blood Gluc Sensor (FREESTYLE LIBRE 14 DAY SENSOR) MISC USE ONE EACH EVERY 14 DAYS TO CHECK BLOOD GLUCOSE **NOT COVERED BY MEDICAID** 01/14/18  Yes [provider]  GLUCAGON EMERGENCY 1 MG injection PLEASE SEE ATTACHED FOR DETAILED DIRECTIONS 12/29/17  Yes [provider]  insulin aspart (NOVOLOG) 100 UNIT/ML injection Inject 0-5 Units into the  skin at bedtime. Patient taking differently: Inject 5 Units into the skin 3 (three) times daily with meals.  10/30/17  Yes Katha HammingKonidena, Snehalatha, MD  lisinopril (PRINIVIL,ZESTRIL) 5 MG tablet TAKE 1 TABLET BY MOUTH EVERY DAY 01/13/17  Yes [provider]  TOUJEO SOLOSTAR 300 UNIT/ML SOPN INJECT 0.1 ML (30 UNITS TOTAL) UNDER THE SKIN DAILY AT 0600. 07/29/17  Yes [provider]   Allergies  Allergen Reactions  . Reglan [Metoclopramide] Other (See Comments)    shakes    FAMILY HISTORY:  family history is not on file. SOCIAL HISTORY:  reports that he has quit smoking. He has never used smokeless tobacco. He reports that he has current or past drug history. Drug: Marijuana. Frequency: 0.20 times per week. He reports that he does not drink alcohol.  REVIEW OF SYSTEMS: Positives in BOLD  Constitutional: Negative for fever, chills, weight loss, malaise/fatigue and diaphoresis.  HENT: Negative for hearing loss, ear pain, nosebleeds, congestion, sore throat, neck pain, tinnitus and ear discharge.   Eyes: Negative for blurred vision, double vision, photophobia, pain, discharge and redness.  Respiratory: Negative for cough, hemoptysis, sputum production, shortness of breath, wheezing and stridor.   Cardiovascular: Negative for chest pain, palpitations, orthopnea, claudication, leg swelling and PND.  Gastrointestinal: heartburn, nausea, vomiting, abdominal pain, diarrhea, constipation, blood in stool and melena.  Genitourinary: Negative for dysuria, urgency, frequency, hematuria and flank pain.  Musculoskeletal: myalgias, back pain, joint pain and falls.  Skin: Negative for itching and rash.  Neurological: Negative for  dizziness, tingling, tremors, sensory change, speech change, focal weakness, seizures, loss of consciousness, weakness and headaches.  Endo/Heme/Allergies: Negative for environmental allergies and polydipsia. Does not bruise/bleed easily.  SUBJECTIVE:  c/o generalized  pain   VITAL SIGNS: Temp:  [98.6 F (37 C)] 98.6 F (37 C) (06/05 2106) Pulse Rate:  [89-104] 100 (06/05 2349) Resp:  [18] 18 (06/05 2349) BP: (121-161)/(65-97) 121/87 (06/05 2349) SpO2:  [98 %-100 %] 100 % (06/05 2349) Weight:  [81.6 kg (180 lb)] 81.6 kg (180 lb) (06/05 2106)  PHYSICAL EXAMINATION: General: well developed, well nourished male, NAD  Neuro: alert and oriented, follows commands  HEENT: supple, no JVD  Cardiovascular: sinus tach, no R/G Lungs: clear throughout, even, non labored  Abdomen: +BS x4, soft, non tender, non distended  Musculoskeletal: normal bulk and tone, no edema  Skin: bilateral handcuffs in place no erythema or skin breakdown present   Recent Labs  Lab 02/01/18 2108  NA 127*  K 4.7  CL 91*  CO2 18*  BUN 27*  CREATININE 1.51*  GLUCOSE 423*   Recent Labs  Lab 02/01/18 2108  HGB 13.6  HCT 41.3  WBC 8.5  PLT 408   No results found.  ASSESSMENT / PLAN: Diabetic Ketoacidosis Hyponatremia in setting of DKA  Acute renal failure secondary to hypovolemia  Hx: ADD  P: Continuous telemetry monitoring Continue DKA protocol until anion gap closed  BMP q4hr and CBG's q1hr while on insulin gtt  Trend BMP Replace electrolytes as indicated  Monitor UOP  VTE px: subq heparin  Trend CBC  Monitor for s/sx of bleeding and transfuse for hgb <7 Continue outpatient adderall   Sonda Rumble, AGNP  Pulmonary/Critical Care Pager [phone] (please enter 7 digits) PCCM Consult Pager [phone] (please enter 7 digits)

## 2018-02-08 NOTE — Progress Notes (Deleted)
The patient is a 37 year old male, admitted from jail on 02/02/2018 for acute DKA, discharged the following day. Imaging personally reviewed, chest x-ray 04/06/2016; normal lungs

## 2018-02-09 ENCOUNTER — Inpatient Hospital Stay: Admitting: Internal Medicine

## 2019-07-14 IMAGING — DX DG HAND COMPLETE 3+V*L*
3 series · 3 of 3 positions shown · non-contrast
Comparison: None.

CLINICAL DATA: Bilateral hand pain since yesterday. Swelling across
the MP joints.

EXAM:
LEFT HAND - COMPLETE 3+ VIEW

[hand ap]
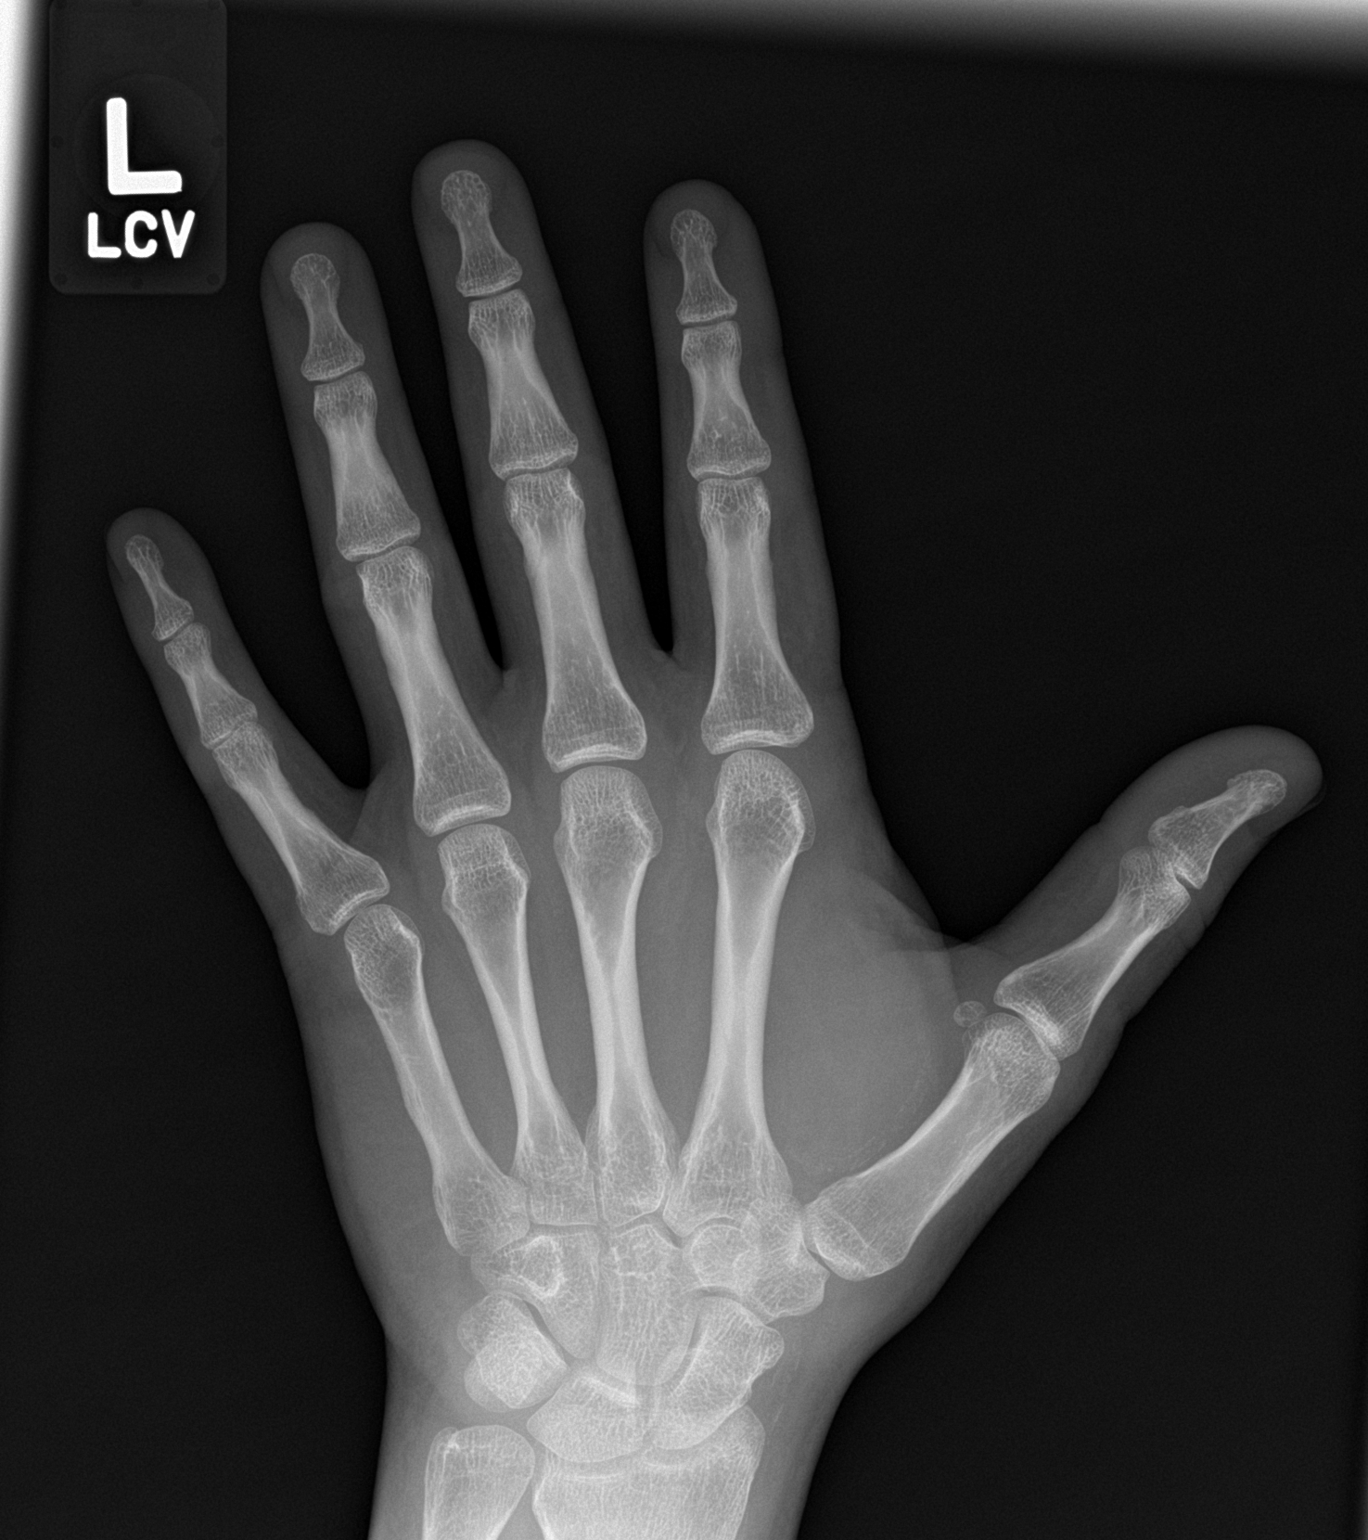

[hand obl]
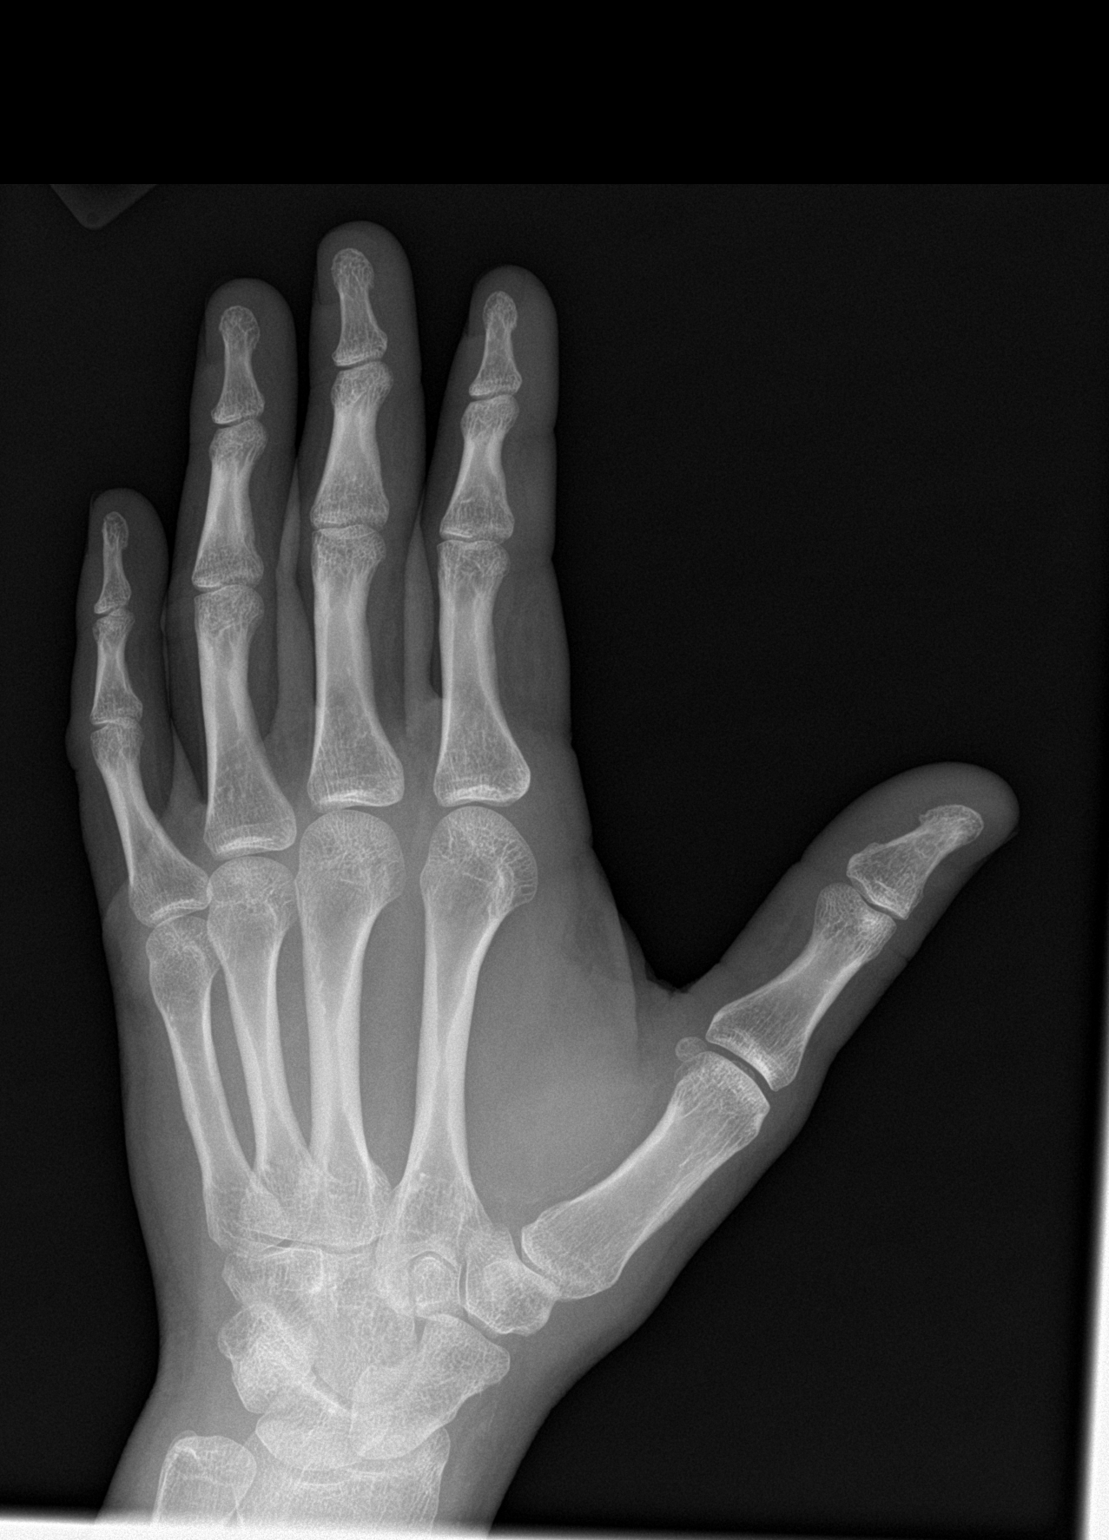

[hand lat]
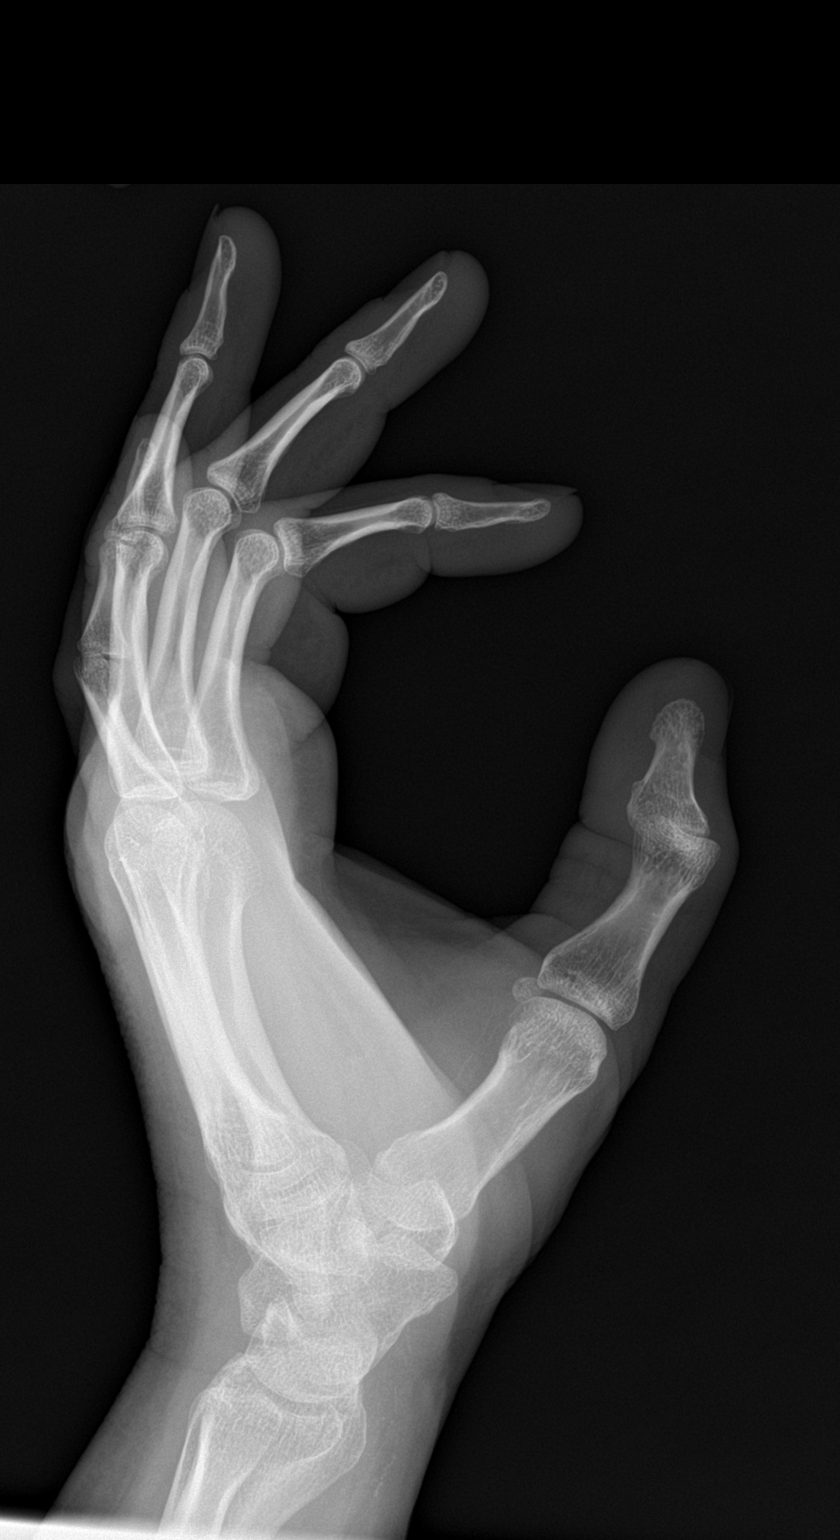

[3 of 3 positions shown; findings below may reference images not displayed]

FINDINGS: There is no evidence of fracture or dislocation. There is no
evidence of arthropathy or other focal bone abnormality. Soft
tissues are unremarkable.
IMPRESSION: No acute osseous injury of the left hand.

## 2019-07-14 IMAGING — DX DG FOOT COMPLETE 3+V*L*
3 series · 3 of 3 positions shown · non-contrast
Comparison: None.

CLINICAL DATA: Left heel pain

EXAM:
LEFT FOOT - COMPLETE 3+ VIEW

[foot ap]
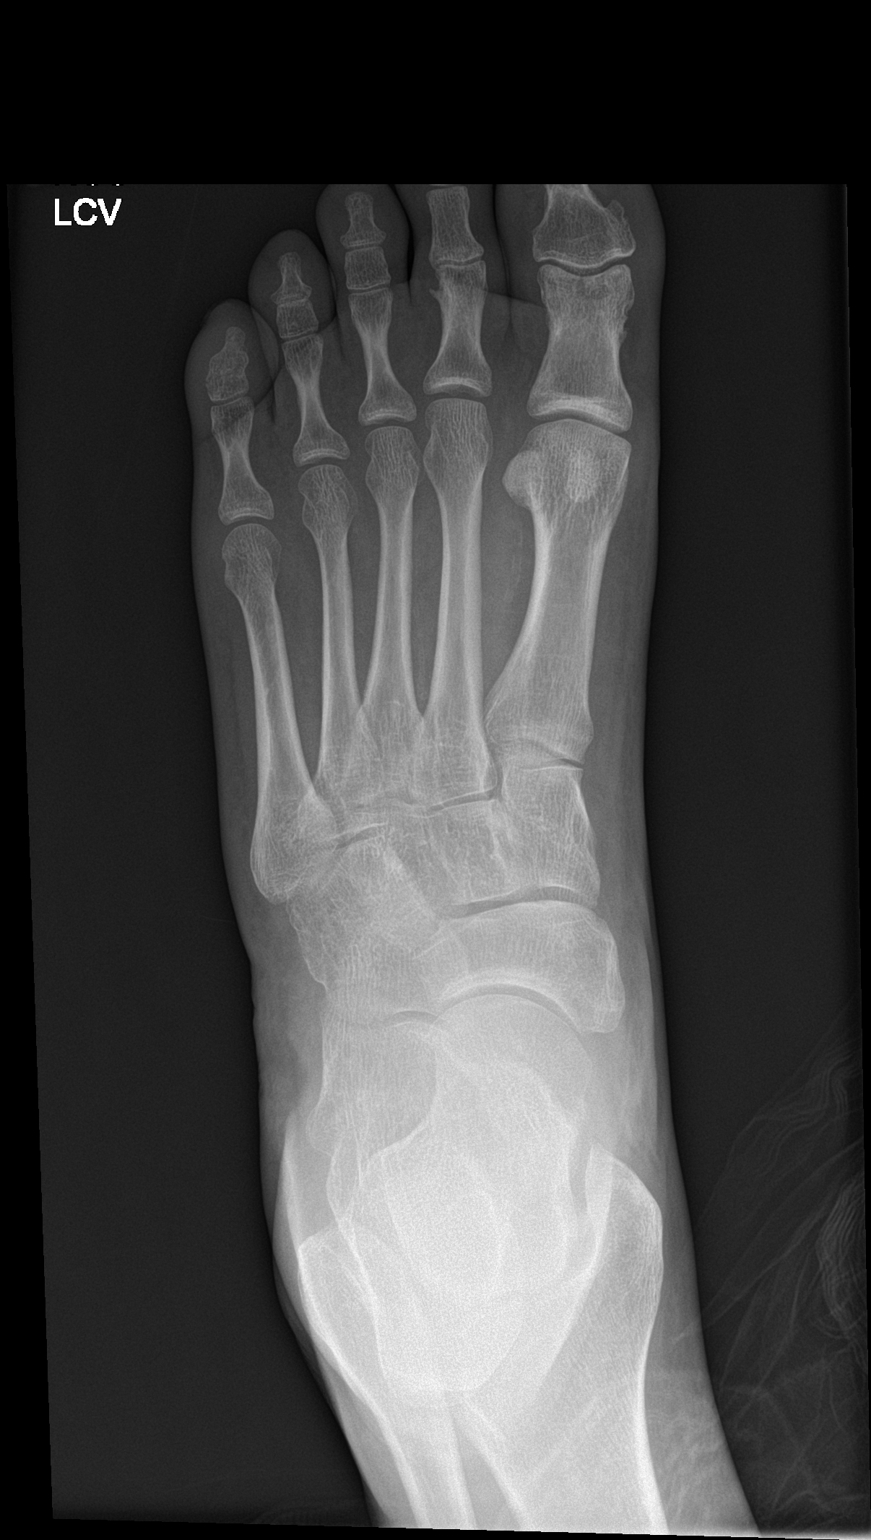

[foot obl]
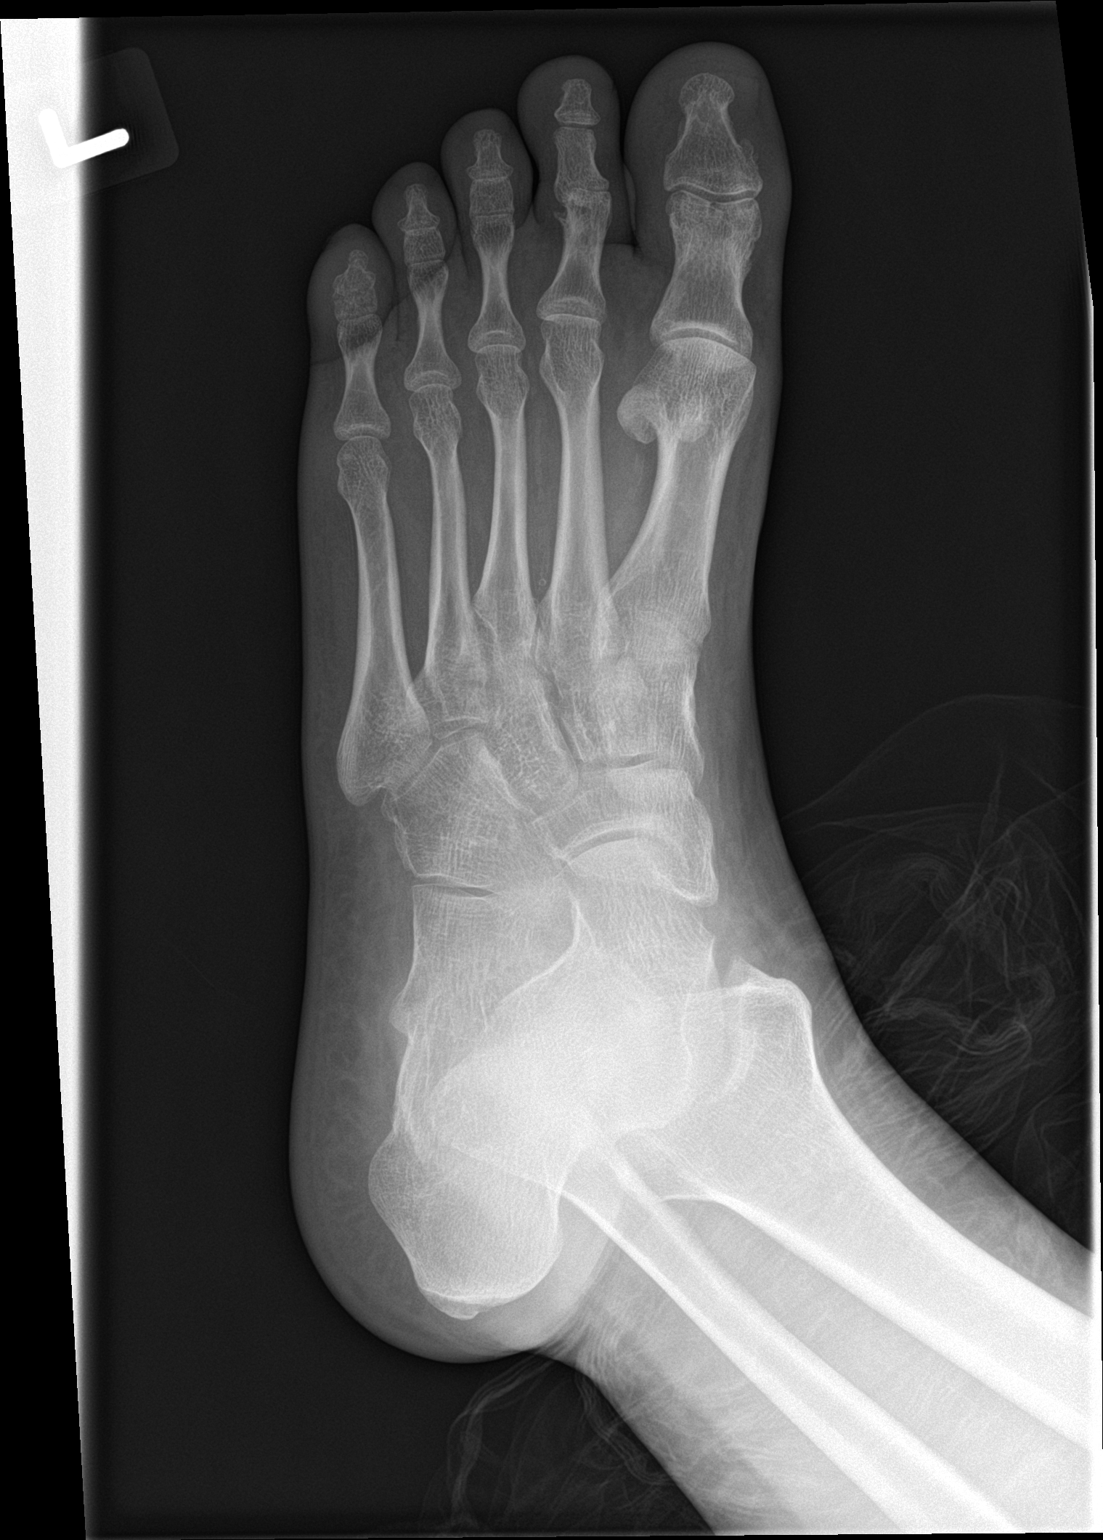

[foot lat]
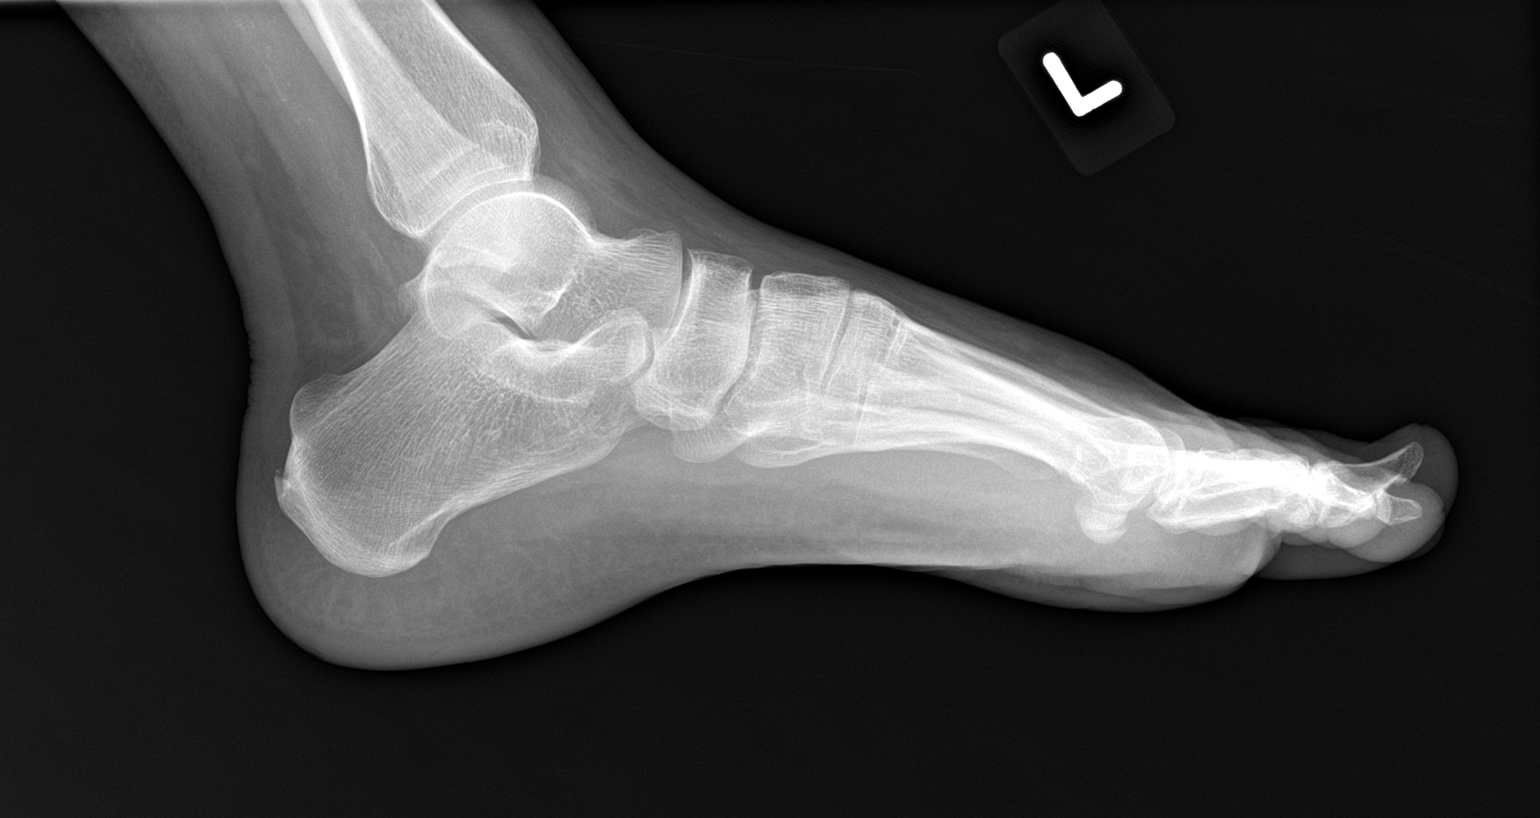

[3 of 3 positions shown; findings below may reference images not displayed]

FINDINGS: There is no evidence of fracture or dislocation. There is no
evidence of arthropathy or other focal bone abnormality. Soft
tissues are unremarkable.
IMPRESSION: No acute osseous injury of the left foot.

## 2019-10-01 DEATH — deceased
